# Patient Record
Sex: Female | Born: 1975 | Race: White | Hispanic: No | Marital: Married | Smoking: Never smoker
Health system: Southern US, Community
[De-identification: ages and names within clinical notes are randomized; demographics above are authoritative.]

## PROBLEM LIST (undated history)

## (undated) DIAGNOSIS — K5792 Diverticulitis of intestine, part unspecified, without perforation or abscess without bleeding: Secondary | ICD-10-CM

## (undated) DIAGNOSIS — Z789 Other specified health status: Secondary | ICD-10-CM

## (undated) HISTORY — PX: ABDOMINAL HYSTERECTOMY: SHX81

---

## 2004-08-04 ENCOUNTER — Emergency Department: Payer: Self-pay | Admitting: Unknown Physician Specialty

## 2006-09-22 ENCOUNTER — Ambulatory Visit: Payer: Self-pay | Admitting: General Surgery

## 2006-11-18 ENCOUNTER — Ambulatory Visit: Payer: Self-pay | Admitting: General Surgery

## 2008-01-10 ENCOUNTER — Emergency Department: Payer: Self-pay | Admitting: Emergency Medicine

## 2008-01-13 ENCOUNTER — Ambulatory Visit: Payer: Self-pay | Admitting: Internal Medicine

## 2009-12-16 ENCOUNTER — Ambulatory Visit: Payer: Self-pay | Admitting: Family Medicine

## 2010-03-12 ENCOUNTER — Ambulatory Visit: Payer: Self-pay | Admitting: Otolaryngology

## 2010-06-17 ENCOUNTER — Ambulatory Visit: Payer: Self-pay | Admitting: General Practice

## 2011-10-28 ENCOUNTER — Ambulatory Visit: Payer: Self-pay | Admitting: General Practice

## 2012-01-06 ENCOUNTER — Ambulatory Visit: Payer: Self-pay | Admitting: Family Medicine

## 2012-04-26 ENCOUNTER — Encounter: Payer: Self-pay | Admitting: Neurosurgery

## 2012-05-18 ENCOUNTER — Ambulatory Visit: Payer: Self-pay | Admitting: Obstetrics and Gynecology

## 2012-05-18 LAB — URINALYSIS, COMPLETE
Glucose,UR: NEGATIVE mg/dL (ref 0–75)
Ketone: NEGATIVE
Leukocyte Esterase: NEGATIVE
Nitrite: NEGATIVE
Ph: 5 (ref 4.5–8.0)
Protein: NEGATIVE
RBC,UR: NONE SEEN /HPF (ref 0–5)
WBC UR: 1 /HPF (ref 0–5)

## 2012-05-18 LAB — PREGNANCY, URINE: Pregnancy Test, Urine: NEGATIVE m[IU]/mL

## 2012-05-18 LAB — CBC: MCV: 90 fL (ref 80–100)

## 2012-05-22 ENCOUNTER — Encounter: Payer: Self-pay | Admitting: Neurosurgery

## 2012-05-25 ENCOUNTER — Ambulatory Visit: Payer: Self-pay | Admitting: Obstetrics and Gynecology

## 2012-05-26 LAB — PATHOLOGY REPORT

## 2012-06-11 ENCOUNTER — Emergency Department: Payer: Self-pay | Admitting: Emergency Medicine

## 2012-06-11 LAB — HCG, QUANTITATIVE, PREGNANCY: Beta Hcg, Quant.: <1 m[IU]/mL — ABNORMAL LOW

## 2012-06-11 LAB — COMPREHENSIVE METABOLIC PANEL
Bilirubin,Total: 0.3 mg/dL (ref 0.2–1.0)
Calcium, Total: 8.5 mg/dL (ref 8.5–10.1)
Chloride: 110 mmol/L — ABNORMAL HIGH (ref 98–107)
Co2: 26 mmol/L (ref 21–32)
Creatinine: 0.87 mg/dL (ref 0.60–1.30)
Osmolality: 279 (ref 275–301)
SGOT(AST): 32 U/L (ref 15–37)
Total Protein: 6 g/dL — ABNORMAL LOW (ref 6.4–8.2)

## 2012-06-11 LAB — CBC
HGB: 13.3 g/dL (ref 12.0–16.0)
MCH: 31.5 pg (ref 26.0–34.0)
RBC: 4.23 10*6/uL (ref 3.80–5.20)
RDW: 13.7 % (ref 11.5–14.5)
WBC: 9.6 10*3/uL (ref 3.6–11.0)

## 2012-08-30 ENCOUNTER — Encounter: Payer: Self-pay | Admitting: Orthopedic Surgery

## 2012-09-21 ENCOUNTER — Encounter: Payer: Self-pay | Admitting: Orthopedic Surgery

## 2012-09-21 ENCOUNTER — Other Ambulatory Visit: Payer: Self-pay | Admitting: Orthopedic Surgery

## 2012-09-28 ENCOUNTER — Ambulatory Visit (HOSPITAL_COMMUNITY)
Admission: RE | Admit: 2012-09-28 | Discharge: 2012-09-28 | Disposition: A | Discharge status: Home / Self Care | Payer: Worker's Compensation | Source: Ambulatory Visit | Attending: Orthopedic Surgery | Admitting: Orthopedic Surgery

## 2012-09-28 ENCOUNTER — Encounter (HOSPITAL_COMMUNITY)
Admission: RE | Admit: 2012-09-28 | Discharge: 2012-09-28 | Disposition: A | Discharge status: Home / Self Care | Payer: Worker's Compensation | Source: Ambulatory Visit | Attending: Orthopedic Surgery | Admitting: Orthopedic Surgery

## 2012-09-28 ENCOUNTER — Encounter (HOSPITAL_COMMUNITY): Payer: Self-pay

## 2012-09-28 ENCOUNTER — Encounter (HOSPITAL_COMMUNITY): Payer: Self-pay | Admitting: Pharmacy Technician

## 2012-09-28 DIAGNOSIS — Z01818 Encounter for other preprocedural examination: Secondary | ICD-10-CM | POA: Insufficient documentation

## 2012-09-28 HISTORY — DX: Other specified health status: Z78.9

## 2012-09-28 LAB — COMPREHENSIVE METABOLIC PANEL
ALT: 26 U/L (ref 0–35)
AST: 23 U/L (ref 0–37)
Albumin: 3.4 g/dL — ABNORMAL LOW (ref 3.5–5.2)
Calcium: 9.1 mg/dL (ref 8.4–10.5)
GFR calc Af Amer: >90 mL/min (ref >90)
Potassium: 4.8 mEq/L (ref 3.5–5.1)
Sodium: 138 mEq/L (ref 135–145)
Total Protein: 6.5 g/dL (ref 6.0–8.3)

## 2012-09-28 LAB — TYPE AND SCREEN
ABO/RH(D): A POS
Antibody Screen: NEGATIVE

## 2012-09-28 LAB — SURGICAL PCR SCREEN
MRSA, PCR: NEGATIVE
Staphylococcus aureus: NEGATIVE

## 2012-09-28 LAB — CBC WITH DIFFERENTIAL/PLATELET
Basophils Absolute: 0.1 10*3/uL (ref 0.0–0.1)
Eosinophils Absolute: 0.2 10*3/uL (ref 0.0–0.7)
Eosinophils Relative: 2 % (ref 0–5)
MCH: 29 pg (ref 26.0–34.0)
MCV: 86.8 fL (ref 78.0–100.0)
Neutrophils Relative %: 57 % (ref 43–77)
Platelets: 278 10*3/uL (ref 150–400)
RDW: 15.1 % (ref 11.5–15.5)
WBC: 10.3 10*3/uL (ref 4.0–10.5)

## 2012-09-28 LAB — URINALYSIS, ROUTINE W REFLEX MICROSCOPIC
Hgb urine dipstick: NEGATIVE
Nitrite: NEGATIVE
Protein, ur: NEGATIVE mg/dL
Urobilinogen, UA: 0.2 mg/dL (ref 0.0–1.0)

## 2012-09-28 LAB — ABO/RH: ABO/RH(D): A POS

## 2012-09-28 MED ORDER — CEFAZOLIN SODIUM-DEXTROSE 2-3 GM-% IV SOLR
2.0000 g | INTRAVENOUS | Status: AC
  Administered 2012-09-29: 2 g via INTRAVENOUS
  Filled 2012-09-28: qty 50

## 2012-09-28 NOTE — Pre-Procedure Instructions (Signed)
ABBIGAYLE TOOLE  09/28/2012   Your procedure is scheduled on:  09/29/12  Report to [facility] at 930 AM.  Call this number if you have problems the morning of surgery: [phone]   Remember:   Do not eat food or drink liquids after midnight.   Take these medicines the morning of surgery with A SIP OF WATER: none   Do not wear jewelry, make-up or nail polish.  Do not wear lotions, powders, or perfumes. You may wear deodorant.  Do not shave 48 hours prior to surgery. Men may shave face and neck.  Do not bring valuables to the hospital.  [facility] is not responsible                   for any belongings or valuables.  Contacts, dentures or bridgework may not be worn into surgery.  Leave suitcase in the car. After surgery it may be brought to your room.  For patients admitted to the hospital, checkout time is 11:00 AM the day of  discharge.   Patients discharged the day of surgery will not be allowed to drive  home.  Name and phone number of your driver: family  Special Instructions: Shower using CHG 2 nights before surgery and the night before surgery.  If you shower the day of surgery use CHG.  Use special wash - you have one bottle of CHG for all showers.  You should use approximately 1/3 of the bottle for each shower.   Please read over the following fact sheets that you were given: Pain Booklet, Coughing and Deep Breathing and Surgical Site Infection Prevention

## 2012-09-29 ENCOUNTER — Encounter (HOSPITAL_COMMUNITY): Payer: Self-pay | Admitting: *Deleted

## 2012-09-29 ENCOUNTER — Encounter (HOSPITAL_COMMUNITY): Payer: Self-pay | Admitting: Certified Registered Nurse Anesthetist

## 2012-09-29 ENCOUNTER — Encounter (HOSPITAL_COMMUNITY)
Admission: RE | Disposition: A | Discharge status: Home / Self Care | Payer: Self-pay | Source: Ambulatory Visit | Attending: Orthopedic Surgery

## 2012-09-29 ENCOUNTER — Ambulatory Visit (HOSPITAL_COMMUNITY): Payer: Worker's Compensation

## 2012-09-29 ENCOUNTER — Ambulatory Visit (HOSPITAL_COMMUNITY): Payer: Worker's Compensation | Admitting: Certified Registered Nurse Anesthetist

## 2012-09-29 ENCOUNTER — Ambulatory Visit (HOSPITAL_COMMUNITY)
Admission: RE | Admit: 2012-09-29 | Discharge: 2012-09-29 | Disposition: A | Discharge status: Home / Self Care | Payer: Worker's Compensation | Source: Ambulatory Visit | Attending: Orthopedic Surgery | Admitting: Orthopedic Surgery

## 2012-09-29 DIAGNOSIS — Z79899 Other long term (current) drug therapy: Secondary | ICD-10-CM | POA: Insufficient documentation

## 2012-09-29 DIAGNOSIS — M533 Sacrococcygeal disorders, not elsewhere classified: Secondary | ICD-10-CM | POA: Insufficient documentation

## 2012-09-29 DIAGNOSIS — Z91010 Allergy to peanuts: Secondary | ICD-10-CM | POA: Insufficient documentation

## 2012-09-29 HISTORY — PX: SACROILIAC JOINT FUSION: SHX6088

## 2012-09-29 SURGERY — SACROILIAC JOINT FUSION
Anesthesia: General | Site: Hip | Laterality: Left | Wound class: Clean

## 2012-09-29 MED ORDER — 0.9 % SODIUM CHLORIDE (POUR BTL) OPTIME
TOPICAL | Status: DC | PRN
  Administered 2012-09-29: 1000 mL

## 2012-09-29 MED ORDER — HYDROCODONE-ACETAMINOPHEN 5-325 MG PO TABS
ORAL_TABLET | ORAL | Status: AC
  Filled 2012-09-29: qty 1

## 2012-09-29 MED ORDER — HYDROMORPHONE HCL PF 1 MG/ML IJ SOLN
INTRAMUSCULAR | Status: AC
  Administered 2012-09-29: 1 mg
  Filled 2012-09-29: qty 1

## 2012-09-29 MED ORDER — HYDROMORPHONE HCL PF 1 MG/ML IJ SOLN
INTRAMUSCULAR | Status: AC
  Filled 2012-09-29: qty 1

## 2012-09-29 MED ORDER — PROPOFOL 10 MG/ML IV BOLUS
INTRAVENOUS | Status: DC | PRN
  Administered 2012-09-29: 200 mg via INTRAVENOUS

## 2012-09-29 MED ORDER — LIDOCAINE HCL (CARDIAC) 20 MG/ML IV SOLN
INTRAVENOUS | Status: DC | PRN
  Administered 2012-09-29: 60 mg via INTRAVENOUS

## 2012-09-29 MED ORDER — HYDROCODONE-ACETAMINOPHEN 5-325 MG PO TABS
1.0000 | ORAL_TABLET | ORAL | Status: DC | PRN
  Administered 2012-09-29: 1 via ORAL

## 2012-09-29 MED ORDER — MIDAZOLAM HCL 5 MG/5ML IJ SOLN
INTRAMUSCULAR | Status: DC | PRN
  Administered 2012-09-29 (×2): 2 mg via INTRAVENOUS

## 2012-09-29 MED ORDER — ROCURONIUM BROMIDE 100 MG/10ML IV SOLN
INTRAVENOUS | Status: DC | PRN
  Administered 2012-09-29: 50 mg via INTRAVENOUS

## 2012-09-29 MED ORDER — LACTATED RINGERS IV SOLN
INTRAVENOUS | Status: DC
  Administered 2012-09-29: 12:00:00 via INTRAVENOUS

## 2012-09-29 MED ORDER — BUPIVACAINE-EPINEPHRINE PF 0.25-1:200000 % IJ SOLN
INTRAMUSCULAR | Status: AC
  Filled 2012-09-29: qty 30

## 2012-09-29 MED ORDER — ONDANSETRON HCL 4 MG/2ML IJ SOLN: 4.0000 mg | Freq: Once | INTRAMUSCULAR | Status: DC | PRN

## 2012-09-29 MED ORDER — FENTANYL CITRATE 0.05 MG/ML IJ SOLN
INTRAMUSCULAR | Status: DC | PRN
  Administered 2012-09-29 (×2): 50 ug via INTRAVENOUS
  Administered 2012-09-29: 100 ug via INTRAVENOUS
  Administered 2012-09-29: 50 ug via INTRAVENOUS

## 2012-09-29 MED ORDER — BUPIVACAINE-EPINEPHRINE PF 0.25-1:200000 % IJ SOLN
INTRAMUSCULAR | Status: DC | PRN
  Administered 2012-09-29: 16 mL

## 2012-09-29 MED ORDER — HYDROMORPHONE HCL PF 1 MG/ML IJ SOLN
0.5000 mg | INTRAMUSCULAR | Status: DC | PRN
  Administered 2012-09-29: 1 mg via INTRAVENOUS

## 2012-09-29 MED ORDER — POVIDONE-IODINE 7.5 % EX SOLN
Freq: Once | CUTANEOUS | Status: DC
Start: 2012-09-29 — End: 2012-09-29
  Filled 2012-09-29: qty 118

## 2012-09-29 MED ORDER — LACTATED RINGERS IV SOLN
INTRAVENOUS | Status: DC | PRN
  Administered 2012-09-29 (×2): via INTRAVENOUS

## 2012-09-29 MED ORDER — GLYCOPYRROLATE 0.2 MG/ML IJ SOLN
INTRAMUSCULAR | Status: DC | PRN
  Administered 2012-09-29: 0.6 mg via INTRAVENOUS

## 2012-09-29 MED ORDER — ONDANSETRON HCL 4 MG/2ML IJ SOLN
INTRAMUSCULAR | Status: DC | PRN
  Administered 2012-09-29 (×2): 4 mg via INTRAVENOUS

## 2012-09-29 MED ORDER — DEXAMETHASONE SODIUM PHOSPHATE 4 MG/ML IJ SOLN
INTRAMUSCULAR | Status: DC | PRN
  Administered 2012-09-29: 4 mg via INTRAVENOUS

## 2012-09-29 MED ORDER — DIPHENHYDRAMINE HCL 50 MG/ML IJ SOLN
INTRAMUSCULAR | Status: DC | PRN
  Administered 2012-09-29: 25 mg via INTRAVENOUS

## 2012-09-29 MED ORDER — HYDROMORPHONE HCL PF 1 MG/ML IJ SOLN
0.2500 mg | INTRAMUSCULAR | Status: DC | PRN
  Administered 2012-09-29 (×2): 0.5 mg via INTRAVENOUS

## 2012-09-29 MED ORDER — NEOSTIGMINE METHYLSULFATE 1 MG/ML IJ SOLN
INTRAMUSCULAR | Status: DC | PRN
  Administered 2012-09-29: 4 mg via INTRAVENOUS

## 2012-09-29 MED ORDER — ESMOLOL HCL 10 MG/ML IV SOLN
INTRAVENOUS | Status: DC | PRN
  Administered 2012-09-29: 20 mg via INTRAVENOUS
  Administered 2012-09-29: 10 mg via INTRAVENOUS

## 2012-09-29 MED ORDER — ARTIFICIAL TEARS OP OINT
TOPICAL_OINTMENT | OPHTHALMIC | Status: DC | PRN
  Administered 2012-09-29: 1 via OPHTHALMIC

## 2012-09-29 MED ORDER — ACETAMINOPHEN 10 MG/ML IV SOLN: 1000.0000 mg | Freq: Once | INTRAVENOUS | Status: DC | PRN

## 2012-09-29 SURGICAL SUPPLY — 52 items
BENZOIN TINCTURE PRP APPL 2/3 (GAUZE/BANDAGES/DRESSINGS) ×2 IMPLANT
BLADE SURG 10 STRL SS (BLADE) ×2 IMPLANT
BLADE SURG 11 STRL SS (BLADE) ×2 IMPLANT
BLADE SURG ROTATE 9660 (MISCELLANEOUS) ×2 IMPLANT
CANISTER SUCTION 2500CC (MISCELLANEOUS) ×2 IMPLANT
CAP-I-FUSE IMPLANT SYSTEM ×2 IMPLANT
CLOTH BEACON ORANGE TIMEOUT ST (SAFETY) ×2 IMPLANT
CLSR STERI-STRIP ANTIMIC 1/2X4 (GAUZE/BANDAGES/DRESSINGS) ×2 IMPLANT
COVER SURGICAL LIGHT HANDLE (MISCELLANEOUS) ×4 IMPLANT
DRAPE C-ARM 42X72 X-RAY (DRAPES) IMPLANT
DRAPE C-ARMOR (DRAPES) ×2 IMPLANT
DRAPE INCISE IOBAN 66X45 STRL (DRAPES) ×2 IMPLANT
DRAPE POUCH INSTRU U-SHP 10X18 (DRAPES) ×2 IMPLANT
DRAPE SURG 17X23 STRL (DRAPES) ×6 IMPLANT
DURAPREP 26ML APPLICATOR (WOUND CARE) ×2 IMPLANT
ELECT CAUTERY BLADE 6.4 (BLADE) ×2 IMPLANT
ELECT REM PT RETURN 9FT ADLT (ELECTROSURGICAL) ×2
ELECTRODE REM PT RTRN 9FT ADLT (ELECTROSURGICAL) ×1 IMPLANT
GAUZE SPONGE 4X4 16PLY XRAY LF (GAUZE/BANDAGES/DRESSINGS) ×2 IMPLANT
GLOVE BIO SURGEON STRL SZ7 (GLOVE) ×2 IMPLANT
GLOVE BIO SURGEON STRL SZ8 (GLOVE) ×2 IMPLANT
GLOVE BIOGEL PI IND STRL 7.0 (GLOVE) ×1 IMPLANT
GLOVE BIOGEL PI IND STRL 8 (GLOVE) ×1 IMPLANT
GLOVE BIOGEL PI INDICATOR 7.0 (GLOVE) ×1
GLOVE BIOGEL PI INDICATOR 8 (GLOVE) ×1
GOWN STRL NON-REIN LRG LVL3 (GOWN DISPOSABLE) ×4 IMPLANT
GOWN STRL REIN XL XLG (GOWN DISPOSABLE) ×2 IMPLANT
KIT BASIN OR (CUSTOM PROCEDURE TRAY) ×2 IMPLANT
KIT ROOM TURNOVER OR (KITS) ×2 IMPLANT
MANIFOLD NEPTUNE II (INSTRUMENTS) ×2 IMPLANT
NEEDLE 22X1 1/2 (OR ONLY) (NEEDLE) ×2 IMPLANT
NEEDLE HYPO 25GX1X1/2 BEV (NEEDLE) ×2 IMPLANT
NS IRRIG 1000ML POUR BTL (IV SOLUTION) ×2 IMPLANT
PACK UNIVERSAL I (CUSTOM PROCEDURE TRAY) ×2 IMPLANT
PAD ARMBOARD 7.5X6 YLW CONV (MISCELLANEOUS) ×4 IMPLANT
PENCIL BUTTON HOLSTER BLD 10FT (ELECTRODE) ×2 IMPLANT
SPONGE GAUZE 4X4 12PLY (GAUZE/BANDAGES/DRESSINGS) ×2 IMPLANT
SPONGE LAP 18X18 X RAY DECT (DISPOSABLE) ×2 IMPLANT
STAPLER VISISTAT 35W (STAPLE) IMPLANT
STRIP CLOSURE SKIN 1/2X4 (GAUZE/BANDAGES/DRESSINGS) ×2 IMPLANT
SUT MNCRL AB 4-0 PS2 18 (SUTURE) ×2 IMPLANT
SUT VIC AB 0 CT1 18XCR BRD 8 (SUTURE) ×1 IMPLANT
SUT VIC AB 0 CT1 8-18 (SUTURE) ×1
SUT VIC AB 2-0 CT2 18 VCP726D (SUTURE) ×2 IMPLANT
SYR BULB IRRIGATION 50ML (SYRINGE) ×2 IMPLANT
SYR CONTROL 10ML LL (SYRINGE) ×2 IMPLANT
TAPE CLOTH SURG 6X10 WHT LF (GAUZE/BANDAGES/DRESSINGS) ×2 IMPLANT
TOWEL OR 17X24 6PK STRL BLUE (TOWEL DISPOSABLE) ×2 IMPLANT
TOWEL OR 17X26 10 PK STRL BLUE (TOWEL DISPOSABLE) ×4 IMPLANT
TUBE CONNECTING 12X1/4 (SUCTIONS) ×2 IMPLANT
WATER STERILE IRR 1000ML POUR (IV SOLUTION) ×2 IMPLANT
YANKAUER SUCT BULB TIP NO VENT (SUCTIONS) ×2 IMPLANT

## 2012-09-29 NOTE — H&P (Signed)
PREOPERATIVE H&P  Chief Complaint: left low back pain  HPI: Gina Hester is a 37 y.o. female who presents with ongoing left low back pain from SI dysfunction. Failed multiple forms of conservative care.  Past Medical History  Diagnosis Date  . Medical history non-contributory    Past Surgical History  Procedure Laterality Date  . Abdominal hysterectomy     History   Social History  . Marital Status: Married    Spouse Name: N/A    Number of Children: N/A  . Years of Education: N/A   Social History Main Topics  . Smoking status: Never Smoker   . Smokeless tobacco: Not on file  . Alcohol Use: Yes     Comment: occ  . Drug Use: No  . Sexually Active: Not on file   Other Topics Concern  . Not on file   Social History Narrative  . No narrative on file   No family history on file. Allergies  Allergen Reactions  . Peanut-Containing Drug Products Anaphylaxis  . Percocet (Oxycodone-Acetaminophen) Nausea Only   Prior to Admission medications   Medication Sig Start Date End Date Taking? Authorizing Provider  cyclobenzaprine (FLEXERIL) 10 MG tablet Take 10 mg by mouth 2 (two) times daily with a meal.    Historical Provider, MD  ibuprofen (ADVIL,MOTRIN) 200 MG tablet Take 200 mg by mouth every 6 (six) hours as needed for pain. For pain    Historical Provider, MD  meloxicam (MOBIC) 15 MG tablet Take 15 mg by mouth every morning.    Historical Provider, MD  metaxalone (SKELAXIN) 800 MG tablet Take 800 mg by mouth 2 (two) times daily.    Historical Provider, MD     All other systems have been reviewed and were otherwise negative with the exception of those mentioned in the HPI and as above.  Physical Exam: There were no vitals filed for this visit.  General: Alert, no acute distress Cardiovascular: No pedal edema Respiratory: No cyanosis, no use of accessory musculature GI: No organomegaly, abdomen is soft and non-tender Skin: No lesions in the area of chief  complaint Neurologic: Sensation intact distally Psychiatric: Patient is competent for consent with normal mood and affect Lymphatic: No axillary or cervical lymphadenopathy  MUSCULOSKELETAL: + TTP left low back  Assessment/Plan: Left SI dysfunction Plan for Procedure(s): LEFT SACROILIAC JOINT FUSION   Emilee Hero, MD 09/29/2012 8:16 AM

## 2012-09-29 NOTE — Transfer of Care (Signed)
Immediate Anesthesia Transfer of Care Note  Patient: Gina Hester  Procedure(s) Performed: Procedure(s) with comments: SACROILIAC JOINT FUSION- left (Left) - Left sided sacroiliac joint fusion  Patient Location: PACU  Anesthesia Type:General  Level of Consciousness: awake, alert  and oriented  Airway & Oxygen Therapy: Patient Spontanous Breathing and Patient connected to nasal cannula oxygen  Post-op Assessment: Report given to PACU RN and Post -op Vital signs reviewed and stable  Post vital signs: Reviewed and stable  Complications: No apparent anesthesia complications

## 2012-09-29 NOTE — Anesthesia Procedure Notes (Signed)
Procedure Name: Intubation Date/Time: 09/29/2012 5:31 PM Performed by: Margaree Mackintosh Pre-anesthesia Checklist: Patient identified, Timeout performed, Emergency Drugs available, Suction available and Patient being monitored Patient Re-evaluated:Patient Re-evaluated prior to inductionOxygen Delivery Method: Circle system utilized Preoxygenation: Pre-oxygenation with 100% oxygen Intubation Type: IV induction Ventilation: Mask ventilation without difficulty Laryngoscope Size: Mac and 3 Grade View: Grade II Tube type: Oral Tube size: 7.0 mm Number of attempts: 1 Airway Equipment and Method: Stylet and LTA kit utilized Placement Confirmation: ETT inserted through vocal cords under direct vision,  positive ETCO2 and breath sounds checked- equal and bilateral Secured at: 22 cm Tube secured with: Tape Dental Injury: Teeth and Oropharynx as per pre-operative assessment

## 2012-09-29 NOTE — Anesthesia Preprocedure Evaluation (Signed)
Anesthesia Evaluation  Patient identified by MRN, date of birth, ID band Patient awake    Reviewed: Allergy & Precautions, H&P , NPO status , Patient's Chart, lab work & pertinent test results  Airway Mallampati: II      Dental  (+) Teeth Intact and Dental Advisory Given   Pulmonary  breath sounds clear to auscultation        Cardiovascular Rhythm:Regular Rate:Normal     Neuro/Psych    GI/Hepatic   Endo/Other    Renal/GU      Musculoskeletal   Abdominal   Peds  Hematology   Anesthesia Other Findings   Reproductive/Obstetrics                           Anesthesia Physical Anesthesia Plan  ASA: I  Anesthesia Plan: General   Post-op Pain Management:    Induction: Intravenous  Airway Management Planned: Oral ETT  Additional Equipment:   Intra-op Plan:   Post-operative Plan: Extubation in OR  Informed Consent: I have reviewed the patients History and Physical, chart, labs and discussed the procedure including the risks, benefits and alternatives for the proposed anesthesia with the patient or authorized representative who has indicated his/her understanding and acceptance.   Dental advisory given  Plan Discussed with: CRNA and Anesthesiologist  Anesthesia Plan Comments:         Anesthesia Quick Evaluation

## 2012-09-29 NOTE — Preoperative (Signed)
Beta Blockers   Reason not to administer Beta Blockers:Not Applicable 

## 2012-09-29 NOTE — Anesthesia Postprocedure Evaluation (Signed)
  Anesthesia Post-op Note  Patient: Gina Hester  Procedure(s) Performed: Procedure(s) with comments: SACROILIAC JOINT FUSION- left (Left) - Left sided sacroiliac joint fusion  Patient Location: PACU  Anesthesia Type:General  Level of Consciousness: awake, alert  and oriented  Airway and Oxygen Therapy: Patient Spontanous Breathing and Patient connected to nasal cannula oxygen  Post-op Pain: mild  Post-op Assessment: Post-op Vital signs reviewed, Patient's Cardiovascular Status Stable, Respiratory Function Stable, Patent Airway and Pain level controlled  Post-op Vital Signs: stable  Complications: No apparent anesthesia complications

## 2012-09-29 NOTE — Progress Notes (Signed)
Orthopedic Tech Progress Note Patient Details:  Gina Hester 05-Jan-1976 [id number]  Ortho Devices Type of Ortho Device: Crutches Ortho Device/Splint Interventions: Ordered;Adjustment   Jennye Moccasin 09/29/2012, 5:58 PM

## 2012-09-30 ENCOUNTER — Encounter (HOSPITAL_COMMUNITY): Payer: Self-pay | Admitting: Orthopedic Surgery

## 2012-09-30 NOTE — Op Note (Signed)
Gina Hester, Gina Hester NO.:  [id number]  MEDICAL RECORD NO.:  [id number]  LOCATION:  [facility]                         FACILITY:  [facility]  PHYSICIAN:  Estill Bamberg, MD      DATE OF BIRTH:  1976-01-17  DATE OF PROCEDURE:  09/29/2012 DATE OF DISCHARGE:  09/29/2012                              OPERATIVE REPORT   PREOPERATIVE DIAGNOSIS:  Left-sided sacroiliac joint dysfunction.  POSTOPERATIVE DIAGNOSIS:  Left-sided sacroiliac joint dysfunction.  PROCEDURES: 1. Left-sided sacroiliac joint fusion using the I-fuse sacroiliac     joint fixation system. 2. Intraoperative use of fluoroscopy.  SURGEON:  Estill Bamberg, MD  ASSISTANT:  Jason Coop, PA-C  ANESTHESIA:  General endotracheal anesthesia.  COMPLICATIONS:  None.  DISPOSITION:  Stable.  ESTIMATED BLOOD LOSS:  Minimal.  INDICATIONS FOR PROCEDURE:  Briefly, Gina Hester is a very pleasant 37 year old female who did initially present to me on July 19, 2012, with pain in both the right and the left sides of her low back.  This did occur after a work injury on November 13, 2011.  We did go forward with multiple forms of conservative care.  Of particular note, the patient did have bilateral sacroiliac joint injections, and did state that she got 100% relief after her injections.  These were intended to be a diagnostic injections.  Thus, I did feel the patient's dysfunction was secondary to sacroiliac joint dysfunction on both the right and the left sides. Given her ongoing pain and failure of conservative care, we did have a discussion regarding going forward with a left-sided sacroiliac joint fusion procedure.  The patient did fully understand the risks and limitations of the procedure.  Of particular note, she did understand that there was a 90% chance that the procedure would alleviate her pain, and a 10% chance that it would not.  Also of note, she did have dysfunction of her right sacroiliac joint as well.  The  plan of the surgery was to proceed with a fusion of her left sacroiliac joint, with possibly proceeding with a right-sided sacroiliac joint fusion in the staged fashion.  OPERATIVE DETAILS:  On September 29, 2012, the patient was brought to surgery and general endotracheal anesthesia was administered.  The patient was placed prone on a well-padded flat hospital bed.  Drills were placed under the patient's chest and hips.  Antibiotics were given.  The region of the left buttock was prepped and draped in the usual sterile fashion. I then made a 3 cm incision distal to the sacral ala.  I then advanced the guidewire across the sacroiliac joint, just above the S1 foramen. The guidewire was advanced across the sacroiliac joint liberally using lateral inlet and outlet radiographs's.  I did confirm excellent trajectory of the guidewire.  I then drilled and broached over the guidewire, and I placed a 7 x 60 mm implant across the sacroiliac joint. The second implant was placed below the first, in line with the S1 foramen.  The guidewire was stopped at the lateral aspect of the foramen, and the foramen was never violated.  I then drilled and broached over the guidewire and at this particular level for this particular implant, I placed  a 7 x 45 mm implant.  A 30 guidewire was placed below the second in the manner described previously.  I drilled and broached over the guide wire and a 7 x 35 mm implant was placed at this level.  I did confirm excellent positioning of the implants on both lateral inlet and outlet radiographs.  At this point, the wound was copiously irrigated.  The fascia was closed using 0 Vicryl and the subcutaneous layer was closed using 2-0 Vicryl.  The skin was closed using 3-0 Monocryl.  Benzoin and Steri-Strips were applied, followed by a sterile dressing.  All instrument counts were correct at the termination of the procedure.  Of note, Jason Coop is my assistant throughout the  entirety of the procedure and did aid in essential retraction, suctioning, and placement of the hardware, in addition to closure.     Estill Bamberg, MD     MD/MEDQ  D:  09/29/2012  T:  09/30/2012  Job:  [id number]  cc:   Jabier Mutton, MD

## 2013-02-24 ENCOUNTER — Encounter: Payer: Self-pay | Admitting: Orthopedic Surgery

## 2013-03-24 ENCOUNTER — Encounter: Payer: Self-pay | Admitting: Orthopedic Surgery

## 2013-04-24 ENCOUNTER — Encounter: Payer: Self-pay | Admitting: Orthopedic Surgery

## 2013-05-22 ENCOUNTER — Encounter: Payer: Self-pay | Admitting: Orthopedic Surgery

## 2013-06-22 ENCOUNTER — Encounter: Payer: Self-pay | Admitting: Orthopedic Surgery

## 2013-07-22 ENCOUNTER — Encounter: Payer: Self-pay | Admitting: Orthopedic Surgery

## 2013-08-22 ENCOUNTER — Encounter: Payer: Self-pay | Admitting: Orthopedic Surgery

## 2014-03-24 DIAGNOSIS — K5792 Diverticulitis of intestine, part unspecified, without perforation or abscess without bleeding: Secondary | ICD-10-CM

## 2014-03-24 HISTORY — DX: Diverticulitis of intestine, part unspecified, without perforation or abscess without bleeding: K57.92

## 2014-07-14 NOTE — Op Note (Signed)
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   POSTOPERATIVE DIAGNOSIS: CIN-2 or moderate cervical dysplasia.   PROCEDURES PERFORMED: Total laparoscopic hysterectomy, cystoscopy.   SURGEON: Senaida LangeLashawn Weaver Lee, M.Hester.   ASSISTANT: Thomasene MohairStephen Jackson, MD   ANESTHESIA: General.   ESTIMATED BLOOD LOSS: 25 mL.  OPERATIVE FLUIDS: 1 liter.   COMPLICATIONS: None.   FINDINGS: Normal appearing uterus, tubes and ovaries.   SPECIMEN: Uterus with cervix.   INDICATIONS: The patient is a 39 year old who presents for evaluation of persistent abnormal Pap smears and findings of CIN-2 on recent colposcopy. The patient desired definitive surgical management in the form of hysterectomy. Risks, benefits, indications and alternatives of the procedure were explained and informed consent was obtained.   DESCRIPTION OF PROCEDURE: The patient was taken to the operating room with IV fluids running. She was prepped and draped in the usual sterile fashion, in [place] stirrups. A speculum was placed inside the vagina. The anterior lip of the cervix was grasped with a single-tooth tenaculum. The uterus was sounded and the cervix was dilated. The V-care uterine manipulator was placed. Attention was turned to the patient's abdomen where a 5 mm infraumbilical incision was placed after 0.5% Sensorcaine was injected. The Veress needle was placed. The abdomen was insufflated with CO2 gas. The Veress needle was removed. The abdomen was entered under direct visualization using a 5 mm Xcel trocar. Two additional incisions and ports were placed on the patient's abdomen laterally, both were 11 mm Xcel trocars. The left uterine cornu was grasped. The tube, the utero-ovarian ligament and the round ligament were cauterized with the Kleppinger and transected with the Harmonic scalpel. The bladder flap was created  using the Harmonic scalpel. The uterine artery was skeletonized, on the left side. It was cauterized with the Kleppinger and transected using the Harmonic scalpel as was the remainder of the broad ligament. The V-care cup was identified. At this time, the right side of the uterus was identified and the surgical steps were repeated. The V-Care cup was identified and the colpotomy was made around the V-Care cup. The uterus was removed through the vagina. The vagina was closed using the Endo Stitch apparatus. All surgical areas were irrigated and found to be hemostatic. The patient was given indigo carmine dye. Attention was turned to the patient's vaginal area where the Foley catheter, which was placed at the beginning of the case, was removed. Cystoscopy was performed. Eye was seen to spill from both ureteral orifices indicating patency of both ureters. There was no bladder injury that was noted. Attention was turned back to the patient's abdomen where again the surgical area was identified. There was no blue dye spillage into the pelvic area indicating integrity of the bladder. The fascia of the 11 mm incisions were closed with 0 Vicryl. The skin was closed with 4-0 Vicryl and the skin of the infraumbilical incision was closed with Dermabond. Sponge, needle and instrument counts were correct x 2, and the patient was awakened from anesthesia and taken to the recovery room in stable condition. ____________________________ Sonda PrimesLashawn A. Patton SallesWeaver-Lee, MD law:sb Hester: 05/25/2012 09:52:37 ET T: 05/25/2012 10:32:18 ET JOB#: [id number]  cc: Flint MelterLashawn A. Patton SallesWeaver-Lee, MD, <Dictator> Sonda PrimesLASHAWN A WEAVER LEE MD ELECTRONICALLY SIGNED 06/03/2012 10:15

## 2014-07-14 NOTE — Consult Note (Signed)
PATIENT NAME:  Gina Hester, Gina Hester MR#:  [id number] DATE OF BIRTH:  [birth year]  DATE OF CONSULTATION:  06/11/2012  REFERRING PHYSICIAN:  Daryel NovemberJonathan Williams, MD CONSULTING PHYSICIAN:  Conard NovakStephen Hester. Erla Bacchi, MD  REASON FOR CONSULTATION:  Vaginal hemorrhage in the setting of being postoperative from a hysterectomy three weeks ago.     HISTORY OF PRESENT ILLNESS:  The patient is a 39 year old G1, 29P1 female who is status post total laparoscopic hysterectomy for cervical dysplasia on March 4th.  The surgery was uncomplicated.  After the surgery she had cystoscopy which was normal.  She was seen in the office last week and per the patient's report she was started on both Keflex as well as clindamycin for what per her report sounds like a urinary tract infection.  She states that she had a little bit of spotting at the time of that visit, but was told that that is a normal finding.  She was also told that the vaginal cuff was intact where the stitches were located.  She awoke this morning and went to the restroom. When she went to the bathroom she noticed a large gush of bright red blood as well as clots.  She continued to have passage of clots as well as bright red bleeding and called EMS.  She states that at this point her bleeding has slowed considerably.  She does feel somewhat weak, but apart from that was mostly concerned about the bleeding given her recent surgery.  She denies fevers and chills.  Denies urinary symptoms.    PAST MEDICAL HISTORY:  Cervical dysplasia.   PAST SURGICAL HISTORY: 1.  Tonsillectomy.  2.  Total laparoscopic hysterectomy and cystoscopy on 05/25/2012 with Dr. Senaida LangeLashawn Weaver-Lee.   CURRENT MEDICATIONS:   1.  Keflex. 2.  Clindamycin.  3.  Hydrocodone.   ALLERGIES:  1.  PENICILLIN. 2.  PEANUTS.  OBSTETRICAL HISTORY:  She is a gravida 1, para 1.  She is vaginally parous x 1.   GYNECOLOGIC HISTORY:  History of CIN-2 cervical dysplasia.   SOCIAL HISTORY:  The patient denies  use of alcohol, tobacco as well as drugs.   FAMILY HISTORY:  Noncontributory.   PHYSICAL EXAMINATION:  VITAL SIGNS:  Temp 97.4 degrees, pulse 77, blood pressure 141/87, respiratory rate 18, O2 saturation 99% on room air.   GENERAL:  No apparent distress.  CARDIOVASCULAR:  Regular rate and rhythm.  PULMONARY:  Clear to auscultation bilaterally.  ABDOMEN:  Soft.  Mildly tender to palpation in the suprapubic region.  Positive bowel sounds noted.  No rebound or guarding.  Her surgical incision scars are all without erythema, induration, warmth or tenderness.  They still have some Dermabond in place.  EXTREMITIES:  No erythema, cords or tenderness.  PELVIC:  Reveals normal external female genitalia.  Normal Bartholin, Skene's glands.  Normal urethral meatus.  Vaginal mucosa appears pink and normally rugated.  There is a large amount of what appears to be an old clot, approximately 80 to 100 mL were removed after placement of the speculum in the form of mostly clot with some small amount of bright red blood.  After removal of clot and bright red blood, the vaginal cuff was able to be visualized where approximately a 1.5 cm portion appeared to be opened with only very mild oozing from the edges of the cuff with no active bleeding or no active extrusion of clot through the defect.  Silver nitrate was applied to the vaginal epithelial edges of the dehiscence.  Bimanual  examination reveals only mild cuff tenderness with the defect palpated, however there was no definite communication with the defect and intra-abdominal cavity.  There felt like some tissue in place, difficult to distinguish its characteristic.  The size of the palpable defect correlates well with the visual defect.   IMAGING STUDIES:  CT of the abdomen, pelvis is done on 06/11/2012.  Significant findings include a 6.9 x 5.2 cm complex fluid collection in the region of the vaginal cuff that is just posterior to the bladder, most concerning for  hematoma (this is per radiology report).  There is a small amount of pelvic free fluid.  No other significant findings noted.    LABORATORY VALUES:  (Pertinents only) glucose 88, creatinine 0.87, sodium 141, potassium 3.9, serum CO2 26.  Serum chloride 110, anion gap 5.  LFTs within normal limits.  WBC equals 9.6, hemoglobin equal to 13.3, hematocrit equaled 37.9, platelets equals 259, MCV equals 90.   ASSESSMENT AND RECOMMENDATIONS:  This is a 39 year old female who is now 17 days postop from a total laparoscopic hysterectomy for cervical dysplasia with a vaginal cuff dehiscence as well as hematoma.   1.  Vaginal cuff dehiscence:  The dehiscence is a small defect that appears to be present and is a likely passageway for passage of clot which is likely emanating from the pelvis.  There may be some active bleeding from the edge of the dehiscence either from the vaginal epithelium or potentially some granulation tissue.  I attempted to cauterize this tissue edges using silver nitrate topically.  This did seem to stop any oozing that was present prior to the application of this medication topically.  Bimanual examination confirms the visual inspection of this area in that there does not appear to be a definite communication with the pelvic cavity and it appears to be an area that may allow for drainage of pelvic hematoma.  She is stable from vital sign standpoint, therefore there is no emergent need to return her to the operating room to fix the vaginal cuff defect at this point.   2.  Pelvic hematoma:  It is hard to know the exact origin of this hematoma.  Possible origins include active venous bleeding that has been very slow since the surgery.  Other source could be from the cervical cuff dehiscence with the bleeding from either the edge of the peritoneum if disrupted or from the edge of the vaginal cuff itself.  Given her clinical stability it merits observation.  Her vital signs are stable and her blood  counts are such that she is likely a good candidate for outpatient observation.  I have discussed this with her and she is comfortable with follow-up on Monday, in 3 days, with Dr. Patton Salles.  I did give her very strict bleeding precautions should her bleeding increase, but if she is soaking through greater than one pad per hour or if her bleeding increases to a point that is concerning to her or should she have symptoms of having too much blood loss.  With these symptoms she should return to the ER.  Of note, after my consultation and after my recommendations had been made to the attending in the Emergency Room, another set of labs were drawn and I only noted them after checking in on the patient to see if she was still in the hospital and to gather my notes for this dictation, that another hemoglobin and hematocrit had been checked.  The new hemoglobin was 10.1, down from  13.3 and the hematocrit was 30.2, down from 37.9.  It does appear that patient has been discharged at this time.  Again, the patient does know how to access the on-call physician and we will have her follow up Monday unless she needs to return to the hospital for any reason.    ____________________________ Conard Novak, MD sdj:ea Hester: 06/11/2012 16:37:03 ET T: 06/11/2012 23:50:06 ET JOB#: [id number]  cc: Conard Novak, MD, <Dictator> Conard Novak MD ELECTRONICALLY SIGNED 07/19/2012 13:11

## 2014-07-14 NOTE — Consult Note (Signed)
Brief Consult Note: Diagnosis: vaginal cuff hematoma, small vaginal cuff dehiscence.   Patient was seen by consultant.   Consult note dictated.   Discussed with Attending MD.   Comments: patient is hemodynamically stable, afebrile, with normal labs (hct 37, wbc 9.6). She otherwise feels well.  Will have her follow up in 3 days in clinic with Dr. Patton SallesWeaver-Lee. Spoke by phone with Dr. Patton SallesWeaver-Lee to give her update on her patient. ** Of note: another hgb/hct was drawn after this consultation. the result was 10.1/30.2.  I was not made aware of this drop in lab, nor was I aware it was being drawn.  I had made my report to attending who requested consult.  Patient was subsequently discharged after this result. I did give patient strict bleeding precautions (>1 pad per hour or bleeding causing further concern).  Electronic Signatures for Addendum Section:  Conard NovakJackson, Whitlee Sluder D (MD) (Signed Addendum 21-Mar-14 16:43)  Called patient to discuss new lab findings and left generic VM.  Dr. Patton SallesWeaver-Lee is on for gynecology this weekend. She has stated that she will call patient to follow up and make sure she knows when/if to return to ER.   Electronic Signatures: Conard NovakJackson, Melonie Germani D (MD)  (Signed 21-Mar-14 16:22)  Authored: Brief Consult Note   Last Updated: 21-Mar-14 16:43 by Conard NovakJackson, Akaya Proffit D (MD)

## 2014-10-09 ENCOUNTER — Ambulatory Visit
Admission: RE | Admit: 2014-10-09 | Discharge: 2014-10-09 | Disposition: A | Discharge status: Home / Self Care | Payer: PRIVATE HEALTH INSURANCE | Source: Ambulatory Visit | Attending: Orthopedic Surgery | Admitting: Orthopedic Surgery

## 2014-10-09 ENCOUNTER — Other Ambulatory Visit: Payer: Self-pay | Admitting: Orthopedic Surgery

## 2014-10-09 ENCOUNTER — Ambulatory Visit
Admission: RE | Admit: 2014-10-09 | Discharge: 2014-10-09 | Disposition: A | Discharge status: Home / Self Care | Payer: PRIVATE HEALTH INSURANCE | Source: Ambulatory Visit | Attending: Emergency Medicine | Admitting: Emergency Medicine

## 2014-10-09 DIAGNOSIS — R52 Pain, unspecified: Secondary | ICD-10-CM

## 2014-10-09 DIAGNOSIS — X58XXXA Exposure to other specified factors, initial encounter: Secondary | ICD-10-CM | POA: Diagnosis not present

## 2014-10-09 DIAGNOSIS — S99911A Unspecified injury of right ankle, initial encounter: Secondary | ICD-10-CM | POA: Diagnosis not present

## 2014-10-09 DIAGNOSIS — S99919A Unspecified injury of unspecified ankle, initial encounter: Secondary | ICD-10-CM | POA: Diagnosis present

## 2015-01-20 ENCOUNTER — Emergency Department: Payer: PRIVATE HEALTH INSURANCE

## 2015-01-20 ENCOUNTER — Encounter: Payer: Self-pay | Admitting: Emergency Medicine

## 2015-01-20 ENCOUNTER — Emergency Department
Admission: EM | Admit: 2015-01-20 | Discharge: 2015-01-20 | Disposition: A | Discharge status: Home / Self Care | Payer: PRIVATE HEALTH INSURANCE | Attending: Emergency Medicine | Admitting: Emergency Medicine

## 2015-01-20 DIAGNOSIS — Z791 Long term (current) use of non-steroidal anti-inflammatories (NSAID): Secondary | ICD-10-CM | POA: Diagnosis not present

## 2015-01-20 DIAGNOSIS — N83209 Unspecified ovarian cyst, unspecified side: Secondary | ICD-10-CM

## 2015-01-20 DIAGNOSIS — K921 Melena: Secondary | ICD-10-CM | POA: Diagnosis not present

## 2015-01-20 DIAGNOSIS — R1084 Generalized abdominal pain: Secondary | ICD-10-CM

## 2015-01-20 DIAGNOSIS — Z79899 Other long term (current) drug therapy: Secondary | ICD-10-CM | POA: Insufficient documentation

## 2015-01-20 DIAGNOSIS — R109 Unspecified abdominal pain: Secondary | ICD-10-CM | POA: Diagnosis present

## 2015-01-20 DIAGNOSIS — N83201 Unspecified ovarian cyst, right side: Secondary | ICD-10-CM | POA: Diagnosis not present

## 2015-01-20 HISTORY — DX: Diverticulitis of intestine, part unspecified, without perforation or abscess without bleeding: K57.92

## 2015-01-20 LAB — URINALYSIS COMPLETE WITH MICROSCOPIC (ARMC ONLY)
BILIRUBIN URINE: NEGATIVE
Bacteria, UA: NONE SEEN
GLUCOSE, UA: NEGATIVE mg/dL
Hgb urine dipstick: NEGATIVE
KETONES UR: NEGATIVE mg/dL
Leukocytes, UA: NEGATIVE
Nitrite: NEGATIVE
Protein, ur: NEGATIVE mg/dL
RBC / HPF: NONE SEEN RBC/hpf (ref 0–5)
Specific Gravity, Urine: 1.004 — ABNORMAL LOW (ref 1.005–1.030)
pH: 7 (ref 5.0–8.0)

## 2015-01-20 LAB — COMPREHENSIVE METABOLIC PANEL
ALT: 13 U/L — AB (ref 14–54)
AST: 18 U/L (ref 15–41)
Albumin: 4.8 g/dL (ref 3.5–5.0)
Alkaline Phosphatase: 44 U/L (ref 38–126)
Anion gap: 4 — ABNORMAL LOW (ref 5–15)
BILIRUBIN TOTAL: 0.9 mg/dL (ref 0.3–1.2)
BUN: 13 mg/dL (ref 6–20)
CO2: 27 mmol/L (ref 22–32)
CREATININE: 0.79 mg/dL (ref 0.44–1.00)
Calcium: 9.1 mg/dL (ref 8.9–10.3)
Chloride: 107 mmol/L (ref 101–111)
Glucose, Bld: 90 mg/dL (ref 65–99)
Potassium: 3.9 mmol/L (ref 3.5–5.1)
Sodium: 138 mmol/L (ref 135–145)
TOTAL PROTEIN: 7.8 g/dL (ref 6.5–8.1)

## 2015-01-20 LAB — CBC WITH DIFFERENTIAL/PLATELET
BASOS ABS: 0.2 10*3/uL — AB (ref 0–0.1)
Basophils Relative: 2 %
EOS PCT: 3 %
Eosinophils Absolute: 0.2 10*3/uL (ref 0–0.7)
HEMATOCRIT: 41.2 % (ref 35.0–47.0)
Hemoglobin: 13.7 g/dL (ref 12.0–16.0)
LYMPHS ABS: 2.6 10*3/uL (ref 1.0–3.6)
LYMPHS PCT: 32 %
MCH: 29.8 pg (ref 26.0–34.0)
MCHC: 33.3 g/dL (ref 32.0–36.0)
MCV: 89.3 fL (ref 80.0–100.0)
MONO ABS: 0.5 10*3/uL (ref 0.2–0.9)
Monocytes Relative: 7 %
NEUTROS ABS: 4.6 10*3/uL (ref 1.4–6.5)
Neutrophils Relative %: 56 %
Platelets: 205 10*3/uL (ref 150–440)
RBC: 4.61 MIL/uL (ref 3.80–5.20)
RDW: 13.4 % (ref 11.5–14.5)
WBC: 8.2 10*3/uL (ref 3.6–11.0)

## 2015-01-20 LAB — LIPASE, BLOOD: LIPASE: 22 U/L (ref 11–51)

## 2015-01-20 MED ORDER — IOHEXOL 240 MG/ML SOLN
25.0000 mL | Freq: Once | INTRAMUSCULAR | Status: AC | PRN
  Administered 2015-01-20: 25 mL via ORAL

## 2015-01-20 MED ORDER — IOHEXOL 300 MG/ML  SOLN
100.0000 mL | Freq: Once | INTRAMUSCULAR | Status: AC | PRN
  Administered 2015-01-20: 100 mL via INTRAVENOUS

## 2015-01-20 MED ORDER — HYDROCODONE-ACETAMINOPHEN 5-325 MG PO TABS: 1.0000 | ORAL_TABLET | ORAL | Status: DC | PRN

## 2015-01-20 MED ORDER — DOCUSATE SODIUM 100 MG PO CAPS: ORAL_CAPSULE | ORAL | Status: DC

## 2015-01-20 NOTE — Discharge Instructions (Signed)
You have been seen in the Emergency Department (ED) for abdominal pain.  Your evaluation suggests that your pain is a result of a hemorrhagic ovarian cyst, which is very common in generally benign even though it sounds concerning.  Please read through the included information and follow-up both with your GI doctor as scheduled and with a GYN doctor as needed and/or with her primary care physician.  We recommend that you take ibuprofen according to label instructions, or consider taking 600 mg 3 times a day with meals but for no more than 5 days as it may cause some stomach discomfort over an extended period of time.  Take Norco as prescribed for severe pain. Do not drink alcohol, drive or participate in any other potentially dangerous activities while taking this medication as it may make you sleepy. Do not take this medication with any other sedating medications, either prescription or over-the-counter. If you were prescribed Percocet or Vicodin, do not take these with acetaminophen (Tylenol) as it is already contained within these medications.   This medication is an opiate (or narcotic) pain medication and can be habit forming.  Use it as little as possible to achieve adequate pain control.  Do not use or use it with extreme caution if you have a history of opiate abuse or dependence.  If you are on a pain contract with your primary care doctor or a pain specialist, be sure to let them know you were prescribed this medication today from the [facility].  This medication is intended for your use only - do not give any to anyone else and keep it in a secure place where nobody else, especially children, have access to it.  It will also cause or worsen constipation, so you may want to consider taking an over-the-counter stool softener while you are taking this medication.  Please follow up as instructed above regarding todays emergent visit and the symptoms that are bothering  you.  Return to the ED if your abdominal pain worsens or fails to improve, you develop bloody vomiting, bloody diarrhea, you are unable to tolerate fluids due to vomiting, fever greater than 101, or other symptoms that concern you.

## 2015-01-20 NOTE — ED Provider Notes (Signed)
[facility] Emergency Department Provider Note  ____________________________________________  Time seen: Approximately 9:07 AM  I have reviewed the triage vital signs and the nursing notes.   HISTORY  Chief Complaint Abdominal Pain    HPI Gina Hester is a 39 y.o. female who is generally healthy but has a history of diverticulitis diagnosed clinically a couple months ago and treated with antibiotics who presents with abdominal pain and cramping as well as with bloody stools.  The abdominal pain and cramping is severe at worst and mild at best, waxing and waningin intensity.  It has been present for several days.  She states that the bleeding that she sees in her stool has been present ever since her initial bout of diverticulitis.  The diverticulitis occurred about 2 months ago and was diagnosed clinically at an urgent care.  She improved with Cipro and Flagyl, but ever since then she has intermittently seen blood in her stool.    She denies nausea, vomiting, fever/chills, chest pain, shortness of breath.  She states that intermittently she will have constipation and then diarrhea.  She states that her stools are very "thin" in diameter.   Past Medical History  Diagnosis Date  . Medical history non-contributory   . Diverticulitis 2016    There are no active problems to display for this patient.   Past Surgical History  Procedure Laterality Date  . Abdominal hysterectomy    . Sacroiliac joint fusion Left 09/29/2012    Procedure: SACROILIAC JOINT FUSION- left;  Surgeon: Emilee Hero, MD;  Location: [facility] OR;  Service: Orthopedics;  Laterality: Left;  Left sided sacroiliac joint fusion    Current Outpatient Rx  Name  Route  Sig  Dispense  Refill  . naproxen sodium (ALEVE) 220 MG tablet   Oral   Take 220 mg by mouth daily as needed (for pain and/or headache.).         Marland Kitchen piroxicam (FELDENE) 10 MG capsule   Oral   Take 10 mg by mouth 2 (two)  times daily.         Marland Kitchen topiramate (TOPAMAX) 25 MG tablet   Oral   Take 25 mg by mouth 2 (two) times daily.         Marland Kitchen docusate sodium (COLACE) 100 MG capsule      Take 1 tablet once or twice daily as needed for constipation while taking narcotic pain medicine   30 capsule   0   . HYDROcodone-acetaminophen (NORCO/VICODIN) 5-325 MG tablet   Oral   Take 1-2 tablets by mouth every 4 (four) hours as needed for moderate pain.   15 tablet   0     Allergies Peanut-containing drug products; Cefazolin in d5w; and Percocet  History reviewed. No pertinent family history.  Social History Social History  Substance Use Topics  . Smoking status: Never Smoker   . Smokeless tobacco: None  . Alcohol Use: Yes     Comment: occ    Review of Systems Constitutional: No fever/chills Eyes: No visual changes. ENT: No sore throat. Cardiovascular: Denies chest pain. Respiratory: Denies shortness of breath. Gastrointestinal: Abdominal cramping.  No nausea, no vomiting.  Intermittent gross blood in stool. Genitourinary: Negative for dysuria. Musculoskeletal: Negative for back pain. Skin: Negative for rash. Neurological: Negative for headaches, focal weakness or numbness.  10-point ROS otherwise negative.  ____________________________________________   PHYSICAL EXAM:  VITAL SIGNS: ED Triage Vitals  Enc Vitals Group     BP 01/20/15 0901 135/80  mmHg     Pulse Rate 01/20/15 0901 79     Resp --      Temp 01/20/15 0901 98.7 F (37.1 C)     Temp Source 01/20/15 0901 Oral     SpO2 01/20/15 0901 100 %     Weight 01/20/15 0901 160 lb (72.576 kg)     Height 01/20/15 0901 5\' 8"  (1.727 m)     Head Cir --      Peak Flow --      Pain Score --      Pain Loc --      Pain Edu? --      Excl. in GC? --     Constitutional: Alert and oriented. Well appearing and in no acute distress.  Appears mildly uncomfortable Eyes: Conjunctivae are normal. PERRL. EOMI. Head: Atraumatic. Nose: No  congestion/rhinnorhea. Mouth/Throat: Mucous membranes are moist.  Oropharynx non-erythematous. Neck: No stridor.   Cardiovascular: Normal rate, regular rhythm. Grossly normal heart sounds.  Good peripheral circulation. Respiratory: Normal respiratory effort.  No retractions. Lungs CTAB. Gastrointestinal: Soft with moderate generalized tenderness throughout - no specific focality. No distention. No abdominal bruits. No CVA tenderness. Rectal:   Musculoskeletal: No lower extremity tenderness nor edema.  No joint effusions. Neurologic:  Normal speech and language. No gross focal neurologic deficits are appreciated.  Skin:  Skin is warm, dry and intact. No rash noted. Psychiatric: Mood and affect are normal. Speech and behavior are normal.  ____________________________________________   LABS (all labs ordered are listed, but only abnormal results are displayed)  Labs Reviewed  CBC WITH DIFFERENTIAL/PLATELET - Abnormal; Notable for the following:    Basophils Absolute 0.2 (*)    All other components within normal limits  COMPREHENSIVE METABOLIC PANEL - Abnormal; Notable for the following:    ALT 13 (*)    Anion gap 4 (*)    All other components within normal limits  URINALYSIS COMPLETEWITH MICROSCOPIC ([facility] ONLY) - Abnormal; Notable for the following:    Color, Urine STRAW (*)    APPearance CLEAR (*)    Specific Gravity, Urine 1.004 (*)    Squamous Epithelial / LPF 0-5 (*)    All other components within normal limits  LIPASE, BLOOD   ____________________________________________  EKG  Not indicated ____________________________________________  RADIOLOGY   Ct Abdomen Pelvis W Contrast  01/20/2015  CLINICAL DATA:  Pt with diverticulitis in July. Pt states that ever since she has had generalized abd/pelvic pain. Since Thursday pt states that she has not been able to urinate, increased pain while sitting, and stomach distention. EXAM: CT ABDOMEN AND PELVIS WITH CONTRAST TECHNIQUE:  Multidetector CT imaging of the abdomen and pelvis was performed using the standard protocol following bolus administration of intravenous contrast. CONTRAST:  100mL OMNIPAQUE IOHEXOL 300 MG/ML  SOLN COMPARISON:  06/11/2012 FINDINGS: Visualized lung bases clear. Unremarkable liver, nondilated gallbladder, spleen, adrenal glands, kidneys, pancreas, aorta, portal vein. Stomach, small bowel, and colon are nondilated. Normal appendix. Urinary bladder physiologically distended. High attenuation fluid in the pelvis. 3.3 cm right ovarian cystic lesion. Previous hysterectomy. Left ovary unremarkable. No free air. No adenopathy localized. Orthopedic screws across bilateral sacroiliac joints. Disc protrusion L4-5. IMPRESSION: 1. 3.3 cm right ovarian cystic lesion with high-attenuation (probably hemorrhagic) pelvic fluid. Electronically Signed   By: Corlis Leak  Hassell M.D.   On: 01/20/2015 11:50    ____________________________________________   PROCEDURES  Procedure(s) performed: None  Critical Care performed: No ____________________________________________   INITIAL IMPRESSION / ASSESSMENT AND PLAN / ED COURSE  Pertinent labs & imaging results that were available during my care of the patient were reviewed by me and considered in my medical decision making (see chart for details).  Well-appearing, no acute distress, normal vitals, afebrile.  Given the chronicity of the patient's symptoms I doubt we will find an acute cause of her symptoms today, but I will evaluate with CT scan of her abdomen and pelvis.  Labs and urine are unremarkable.  (Note that documentation was delayed due to multiple ED patients requiring immediate care.)   Unremarkable CT scan other than hemorrhagic ovarian cyst.  I give the patient my usual customary return precautions.  There is no indication for further work up at this time.  ____________________________________________  FINAL CLINICAL IMPRESSION(S) / ED DIAGNOSES  Final  diagnoses:  Hemorrhagic ovarian cyst  Generalized abdominal pain      NEW MEDICATIONS STARTED DURING THIS VISIT:  Discharge Medication List as of 01/20/2015 12:25 PM    START taking these medications   Details  docusate sodium (COLACE) 100 MG capsule Take 1 tablet once or twice daily as needed for constipation while taking narcotic pain medicine, Print    HYDROcodone-acetaminophen (NORCO/VICODIN) 5-325 MG tablet Take 1-2 tablets by mouth every 4 (four) hours as needed for moderate pain., Starting 01/20/2015, Until Discontinued, Print         Loleta Rose, MD 01/20/15 [phone]

## 2015-03-07 ENCOUNTER — Ambulatory Visit: Payer: Self-pay | Admitting: Physician Assistant

## 2015-03-07 ENCOUNTER — Encounter: Payer: Self-pay | Admitting: Physician Assistant

## 2015-03-07 VITALS — BP 110/70 | HR 72 | Temp 98.3°F

## 2015-03-07 DIAGNOSIS — J069 Acute upper respiratory infection, unspecified: Secondary | ICD-10-CM

## 2015-03-07 MED ORDER — FLUCONAZOLE 150 MG PO TABS: ORAL_TABLET | ORAL | Status: DC

## 2015-03-07 MED ORDER — AMOXICILLIN 875 MG PO TABS: 875.0000 mg | ORAL_TABLET | Freq: Two times a day (BID) | ORAL | Status: DC

## 2015-03-07 MED ORDER — FLUTICASONE PROPIONATE 50 MCG/ACT NA SUSP: 2.0000 | Freq: Every day | NASAL | Status: DC

## 2015-03-07 NOTE — Progress Notes (Signed)
S: C/o runny nose and congestion for 3 days, no fever, chills, cp/sob, v/d; mucus is green and thick, cough is sporadic, c/o of facial and dental pain. Chest is sore from coughing, multiple coworkers have the same sx  Using otc meds:   O: PE: vitals wnl, nad,  perrl eomi, normocephalic, tms dull, nasal mucosa red and swollen, throat injected, neck supple no lymph, lungs c t a, cv rrr, neuro intact  A:  Acute sinusitis   P: amoxil 875mg  bid x 10d, flonase, drink fluids, continue regular meds , use otc meds of choice, return if not improving in 5 days, return earlier if worsening

## 2015-08-03 ENCOUNTER — Encounter: Payer: Self-pay | Admitting: Physician Assistant

## 2015-08-03 ENCOUNTER — Ambulatory Visit: Payer: Self-pay | Admitting: Physician Assistant

## 2015-08-03 VITALS — BP 118/65 | HR 88 | Temp 99.2°F

## 2015-08-03 DIAGNOSIS — J209 Acute bronchitis, unspecified: Secondary | ICD-10-CM

## 2015-08-03 MED ORDER — LIDOCAINE VISCOUS 2 % MT SOLN: 5.0000 mL | Freq: Four times a day (QID) | OROMUCOSAL | Status: DC | PRN

## 2015-08-03 MED ORDER — AZITHROMYCIN 250 MG PO TABS: ORAL_TABLET | ORAL | Status: DC

## 2015-08-03 MED ORDER — PSEUDOEPH-BROMPHEN-DM 30-2-10 MG/5ML PO SYRP: 5.0000 mL | ORAL_SOLUTION | Freq: Four times a day (QID) | ORAL | Status: DC | PRN

## 2015-08-03 NOTE — Progress Notes (Signed)
   Subjective:cough/sore throat/fever    Patient ID: Gina KoyanagiJennifer D Hester, female    DOB: November 20, 1975, 40 y.o.   MRN: [id number]  HPI Patient c/o one week of sore throat, post nasal drainage and productive cough. States nausea without vomiting. No palliative measure for compliant. Patient has history of allergic rhinitis. States mild fever/chills and body ache. Review of Systems    Negative except for compliant. Objective:   Physical Exam HEENT for edematous nasal turbinates, post nasal drainage, erythematous pharynx and productive cough.  Neck supple. Lungs with Rales.  Heart RRR.       Assessment & Plan:Bronchitis  Zithromax, Bromfed DM, and Viscous Lidocaine as directed.  Take Tylenol/Ibuprofen for fever/body ache. Follow up 3 days if no improvement.

## 2016-02-05 ENCOUNTER — Ambulatory Visit: Payer: Self-pay | Admitting: Physician Assistant

## 2016-02-11 ENCOUNTER — Encounter: Payer: Self-pay | Admitting: Physician Assistant

## 2016-02-11 ENCOUNTER — Ambulatory Visit: Payer: Self-pay | Admitting: Physician Assistant

## 2016-02-11 VITALS — BP 120/80 | HR 64 | Temp 98.7°F

## 2016-02-11 DIAGNOSIS — J069 Acute upper respiratory infection, unspecified: Secondary | ICD-10-CM

## 2016-02-11 MED ORDER — AMOXICILLIN-POT CLAVULANATE 875-125 MG PO TABS: 1.0000 | ORAL_TABLET | Freq: Two times a day (BID) | ORAL | 0 refills | Status: DC

## 2016-02-11 NOTE — Progress Notes (Signed)
S: C/o cough and congestion with chest tightness, chest is sore from coughing, denies fever, chills; unable to get mucus out but can taste it in the back of her mouth, voice is hoarse, completely lost it 2 days ago and its now getting better,  denies cardiac type chest pain or sob, v/d, abd pain; was seen at urgent care 6 days ago, flu and strep test neg, given prednisone and tessalon perls, flonase Remainder ros neg  O: vitals wnl, nad, tms clear, throat injected, neck supple no lymph, lungs with wheezing, cv rrr, neuro intact  A:  Acute bronchitis, uri   P:  rx medication: augmentin,  use otc meds, tylenol or motrin as needed for fever/chills, return if not better in 3 -5 days, return earlier if worsening, continue flonase and tessalon perls as needed

## 2016-02-21 ENCOUNTER — Telehealth: Payer: Self-pay | Admitting: Emergency Medicine

## 2016-02-21 MED ORDER — FLUCONAZOLE 150 MG PO TABS: 150.0000 mg | ORAL_TABLET | Freq: Once | ORAL | 0 refills | Status: AC

## 2016-02-21 NOTE — Telephone Encounter (Signed)
Pt states she got a yeast infection from the amoxil, called in [facility]

## 2016-02-21 NOTE — Telephone Encounter (Signed)
Patient has finished the Amoxicillin and has developed a yeast infection.  Wants D[facility] [place].

## 2016-04-18 ENCOUNTER — Ambulatory Visit: Payer: Self-pay | Admitting: Physician Assistant

## 2016-04-18 ENCOUNTER — Encounter: Payer: Self-pay | Admitting: Physician Assistant

## 2016-04-18 VITALS — BP 122/78 | HR 72 | Temp 98.4°F | Ht 67.0 in | Wt 181.0 lb

## 2016-04-18 DIAGNOSIS — Z0000 Encounter for general adult medical examination without abnormal findings: Secondary | ICD-10-CM

## 2016-04-18 DIAGNOSIS — R5383 Other fatigue: Secondary | ICD-10-CM

## 2016-04-18 NOTE — Progress Notes (Signed)
S: states she is having weight gain and fatigue, her mother has thyroid issues and ?if that's causing her to feel this way, denies fever/chills/cp/sob/abd pain; takes topiramate for back pain,  nonsmoker, allergies as listed on chart, ros neg, fam hx as listed on chart  O: vitals wnl, nad, ENT wnl, neck supple no lymph, thyroid gland is smooth, lungs c t a, cv rrr, abd soft nontender bs normal all 4 quads, will defer pelvic to gyn  A: wellness encounter, biometric form, fatigue  P: labs today, biometric form filled out, pt to make appt with gyn for yearly exam

## 2016-04-20 LAB — CMP12+LP+TP+TSH+6AC+CBC/D/PLT
A/G RATIO: 1.6 (ref 1.2–2.2)
ALK PHOS: 45 IU/L (ref 39–117)
ALT: 11 IU/L (ref 0–32)
AST: 11 IU/L (ref 0–40)
Albumin: 4.1 g/dL (ref 3.5–5.5)
BASOS: 1 %
BUN/Creatinine Ratio: 17 (ref 9–23)
BUN: 12 mg/dL (ref 6–20)
Basophils Absolute: 0.1 10*3/uL (ref 0.0–0.2)
Bilirubin Total: 0.3 mg/dL (ref 0.0–1.2)
CHOL/HDL RATIO: 4.1 ratio (ref 0.0–4.4)
Calcium: 9.2 mg/dL (ref 8.7–10.2)
Chloride: 104 mmol/L (ref 96–106)
Cholesterol, Total: 184 mg/dL (ref 100–199)
Creatinine, Ser: 0.72 mg/dL (ref 0.57–1.00)
EOS (ABSOLUTE): 0.2 10*3/uL (ref 0.0–0.4)
EOS: 3 %
Estimated CHD Risk: 0.9 times avg. (ref 0.0–1.0)
Free Thyroxine Index: 1.8 (ref 1.2–4.9)
GFR calc non Af Amer: 106 mL/min/{1.73_m2} (ref >59)
GFR, EST AFRICAN AMERICAN: 122 mL/min/{1.73_m2} (ref >59)
GGT: 13 IU/L (ref 0–60)
GLOBULIN, TOTAL: 2.5 g/dL (ref 1.5–4.5)
GLUCOSE: 94 mg/dL (ref 65–99)
HDL: 45 mg/dL (ref >39)
HEMATOCRIT: 41.6 % (ref 34.0–46.6)
HEMOGLOBIN: 13.6 g/dL (ref 11.1–15.9)
IMMATURE GRANS (ABS): 0 10*3/uL (ref 0.0–0.1)
Immature Granulocytes: 0 %
Iron: 65 ug/dL (ref 27–159)
LDH: 137 IU/L (ref 119–226)
LDL CALC: 121 mg/dL — AB (ref 0–99)
LYMPHS: 35 %
Lymphocytes Absolute: 2.6 10*3/uL (ref 0.7–3.1)
MCH: 29.1 pg (ref 26.6–33.0)
MCHC: 32.7 g/dL (ref 31.5–35.7)
MCV: 89 fL (ref 79–97)
MONOCYTES: 7 %
Monocytes Absolute: 0.5 10*3/uL (ref 0.1–0.9)
NEUTROS ABS: 4.1 10*3/uL (ref 1.4–7.0)
Neutrophils: 54 %
POTASSIUM: 5.4 mmol/L — AB (ref 3.5–5.2)
Phosphorus: 3.8 mg/dL (ref 2.5–4.5)
Platelets: 262 10*3/uL (ref 150–379)
RBC: 4.67 x10E6/uL (ref 3.77–5.28)
RDW: 13.9 % (ref 12.3–15.4)
Sodium: 143 mmol/L (ref 134–144)
T3 Uptake Ratio: 27 % (ref 24–39)
T4, Total: 6.5 ug/dL (ref 4.5–12.0)
TRIGLYCERIDES: 88 mg/dL (ref 0–149)
TSH: 1.16 u[IU]/mL (ref 0.450–4.500)
Total Protein: 6.6 g/dL (ref 6.0–8.5)
URIC ACID: 4.8 mg/dL (ref 2.5–7.1)
VLDL CHOLESTEROL CAL: 18 mg/dL (ref 5–40)
WBC: 7.5 10*3/uL (ref 3.4–10.8)

## 2016-04-20 LAB — B12 AND FOLATE PANEL
Folate: 9.7 ng/mL (ref >3.0)
Vitamin B-12: 359 pg/mL (ref 232–1245)

## 2016-04-20 LAB — EPSTEIN-BARR VIRUS VCA ANTIBODY PANEL
EBV EARLY ANTIGEN AB, IGG: 13.9 U/mL — AB (ref 0.0–8.9)
EBV VCA IgG: 401 U/mL — ABNORMAL HIGH (ref 0.0–17.9)
EBV VCA IgM: <36 U/mL (ref 0.0–35.9)

## 2016-04-20 LAB — VITAMIN D 25 HYDROXY (VIT D DEFICIENCY, FRACTURES): Vit D, 25-Hydroxy: 49.5 ng/mL (ref 30.0–100.0)

## 2016-05-23 ENCOUNTER — Encounter: Payer: Self-pay | Admitting: Emergency Medicine

## 2016-09-02 ENCOUNTER — Other Ambulatory Visit: Payer: Self-pay | Admitting: Obstetrics and Gynecology

## 2016-09-02 DIAGNOSIS — Z1231 Encounter for screening mammogram for malignant neoplasm of breast: Secondary | ICD-10-CM

## 2016-09-17 ENCOUNTER — Ambulatory Visit
Admission: RE | Admit: 2016-09-17 | Discharge: 2016-09-17 | Disposition: A | Discharge status: Home / Self Care | Payer: Managed Care, Other (non HMO) | Source: Ambulatory Visit | Attending: Obstetrics and Gynecology | Admitting: Obstetrics and Gynecology

## 2016-09-17 DIAGNOSIS — Z1231 Encounter for screening mammogram for malignant neoplasm of breast: Secondary | ICD-10-CM

## 2016-10-29 ENCOUNTER — Encounter: Payer: Self-pay | Admitting: Physician Assistant

## 2016-10-29 ENCOUNTER — Ambulatory Visit: Payer: Self-pay | Admitting: Physician Assistant

## 2016-10-29 VITALS — BP 115/70 | HR 73 | Temp 98.5°F | Resp 16

## 2016-10-29 DIAGNOSIS — M7711 Lateral epicondylitis, right elbow: Secondary | ICD-10-CM

## 2016-10-29 MED ORDER — NAPROXEN 500 MG PO TBEC: 500.0000 mg | DELAYED_RELEASE_TABLET | Freq: Two times a day (BID) | ORAL | 3 refills | Status: DC

## 2016-10-29 NOTE — Progress Notes (Signed)
S: c/o r elbow pain for about 1-2 weeks, bought a tennis elbow brace and it really helped at first, now its hurting more when she takes the brace off, still able to grab for her gun, pain with twisting off jar tops  O: vitals wnl, nad, skin intact, elbow tender at lateral epicondyle, pain reproduced with rotation of forearm, +spasm in lower forearm, grips = b/l, able to reach behind her a grab sidearm without difficulty, n/v intact  A: tennis elbow  P: naproxen 500mg  bid , wet heat, massage and tens unit, follow with ice

## 2016-11-18 ENCOUNTER — Telehealth: Payer: Self-pay | Admitting: Physician Assistant

## 2016-11-18 MED ORDER — DICLOFENAC SODIUM 75 MG PO TBEC: 75.0000 mg | DELAYED_RELEASE_TABLET | Freq: Two times a day (BID) | ORAL | 3 refills | Status: DC

## 2016-11-18 NOTE — Telephone Encounter (Signed)
Refer her to ortho, doesn't matter which one.  I will call in diclofenac

## 2016-12-19 ENCOUNTER — Telehealth: Payer: Self-pay | Admitting: Physician Assistant

## 2017-01-25 ENCOUNTER — Emergency Department: Payer: Managed Care, Other (non HMO)

## 2017-01-25 ENCOUNTER — Encounter: Payer: Self-pay | Admitting: Emergency Medicine

## 2017-01-25 ENCOUNTER — Emergency Department
Admission: EM | Admit: 2017-01-25 | Discharge: 2017-01-25 | Disposition: A | Discharge status: Home / Self Care | Payer: Managed Care, Other (non HMO) | Attending: Emergency Medicine | Admitting: Emergency Medicine

## 2017-01-25 DIAGNOSIS — Y999 Unspecified external cause status: Secondary | ICD-10-CM | POA: Diagnosis not present

## 2017-01-25 DIAGNOSIS — Y929 Unspecified place or not applicable: Secondary | ICD-10-CM | POA: Diagnosis not present

## 2017-01-25 DIAGNOSIS — Z91010 Allergy to peanuts: Secondary | ICD-10-CM | POA: Insufficient documentation

## 2017-01-25 DIAGNOSIS — R51 Headache: Secondary | ICD-10-CM | POA: Insufficient documentation

## 2017-01-25 DIAGNOSIS — Z885 Allergy status to narcotic agent status: Secondary | ICD-10-CM | POA: Diagnosis not present

## 2017-01-25 DIAGNOSIS — S060X0A Concussion without loss of consciousness, initial encounter: Secondary | ICD-10-CM

## 2017-01-25 DIAGNOSIS — W228XXA Striking against or struck by other objects, initial encounter: Secondary | ICD-10-CM | POA: Insufficient documentation

## 2017-01-25 DIAGNOSIS — R112 Nausea with vomiting, unspecified: Secondary | ICD-10-CM | POA: Insufficient documentation

## 2017-01-25 DIAGNOSIS — Z79899 Other long term (current) drug therapy: Secondary | ICD-10-CM | POA: Diagnosis not present

## 2017-01-25 DIAGNOSIS — S0990XA Unspecified injury of head, initial encounter: Secondary | ICD-10-CM | POA: Diagnosis present

## 2017-01-25 DIAGNOSIS — Y93K3 Activity, grooming and shearing an animal: Secondary | ICD-10-CM | POA: Insufficient documentation

## 2017-01-25 LAB — COMPREHENSIVE METABOLIC PANEL
ALK PHOS: 49 U/L (ref 38–126)
ALT: 19 U/L (ref 14–54)
AST: 21 U/L (ref 15–41)
Albumin: 3.8 g/dL (ref 3.5–5.0)
Anion gap: 9 (ref 5–15)
BILIRUBIN TOTAL: 0.7 mg/dL (ref 0.3–1.2)
BUN: 16 mg/dL (ref 6–20)
CALCIUM: 8.6 mg/dL — AB (ref 8.9–10.3)
CO2: 24 mmol/L (ref 22–32)
CREATININE: 0.76 mg/dL (ref 0.44–1.00)
Chloride: 105 mmol/L (ref 101–111)
GFR calc Af Amer: >60 mL/min (ref >60)
GFR calc non Af Amer: >60 mL/min (ref >60)
GLUCOSE: 115 mg/dL — AB (ref 65–99)
Potassium: 3.4 mmol/L — ABNORMAL LOW (ref 3.5–5.1)
Sodium: 138 mmol/L (ref 135–145)
TOTAL PROTEIN: 6.8 g/dL (ref 6.5–8.1)

## 2017-01-25 LAB — CBC
HCT: 38.5 % (ref 35.0–47.0)
Hemoglobin: 12.9 g/dL (ref 12.0–16.0)
MCH: 29.1 pg (ref 26.0–34.0)
MCHC: 33.3 g/dL (ref 32.0–36.0)
MCV: 87.4 fL (ref 80.0–100.0)
Platelets: 264 10*3/uL (ref 150–440)
RBC: 4.41 MIL/uL (ref 3.80–5.20)
RDW: 13.4 % (ref 11.5–14.5)
WBC: 9.1 10*3/uL (ref 3.6–11.0)

## 2017-01-25 LAB — LIPASE, BLOOD: Lipase: 26 U/L (ref 11–51)

## 2017-01-25 MED ORDER — ONDANSETRON 4 MG PO TBDP
4.0000 mg | ORAL_TABLET | Freq: Once | ORAL | Status: AC | PRN
  Administered 2017-01-25: 4 mg via ORAL
  Filled 2017-01-25: qty 1

## 2017-01-25 MED ORDER — ONDANSETRON HCL 4 MG PO TABS
4.0000 mg | ORAL_TABLET | Freq: Once | ORAL | Status: AC
  Administered 2017-01-25: 4 mg via ORAL
  Filled 2017-01-25: qty 1

## 2017-01-25 MED ORDER — ONDANSETRON HCL 4 MG PO TABS: 4.0000 mg | ORAL_TABLET | Freq: Three times a day (TID) | ORAL | 0 refills | Status: DC | PRN

## 2017-01-25 MED ORDER — ACETAMINOPHEN 325 MG PO TABS
650.0000 mg | ORAL_TABLET | Freq: Once | ORAL | Status: AC
  Administered 2017-01-25: 650 mg via ORAL
  Filled 2017-01-25: qty 2

## 2017-01-25 NOTE — ED Notes (Signed)
Pt given meds. Pt sipping on ginger ale.

## 2017-01-25 NOTE — ED Provider Notes (Signed)
[facility] Emergency Department Provider Note  ____________________________________________   First MD Initiated Contact with Patient 01/25/17 1605     (approximate)  I have reviewed the triage vital signs and the nursing notes.   HISTORY  Chief Complaint Head Injury and Emesis   HPI Gina Hester is a 41 y.o. female with a history of diverticulitis who is presenting to the emergency department with a headache, photophobia with nausea and vomiting.  The patient says that she was cutting her dog's hair last night when she stood up and hit the right temple against a cabinet.  She did not lose consciousness but said that the impact was very hard and she has been having pain to the right as well as left side of her head since.  She has had multiple episodes of nausea and vomiting with some difficulty with her balance ever since.  She does not take any blood thinners.  Was given Zofran in triage but then vomited afterwards.  Was given the ODT form at that time.  Does not report any weakness or numbness.  Past Medical History:  Diagnosis Date  . Diverticulitis 2016  . Medical history non-contributory     There are no active problems to display for this patient.   Past Surgical History:  Procedure Laterality Date  . ABDOMINAL HYSTERECTOMY      Prior to Admission medications   Medication Sig Start Date End Date Taking? Authorizing Provider  diclofenac (VOLTAREN) 75 MG EC tablet Take 1 tablet (75 mg total) by mouth 2 (two) times daily. 11/18/16   Faythe Ghee, PA-C  naproxen (EC NAPROSYN) 500 MG EC tablet Take 1 tablet (500 mg total) by mouth 2 (two) times daily with a meal. 10/29/16   Fisher, Roselyn Bering, PA-C  topiramate (TOPAMAX) 25 MG tablet Take 25 mg by mouth 2 (two) times daily.    [provider]    Allergies Peanut oil; Peanut-containing drug products; Cefazolin sodium-dextrose; and Percocet [oxycodone-acetaminophen]  Family History    Problem Relation Age of Onset  . Diabetes Mother   . Hypothyroidism Mother   . Diabetes Father   . Hypertension Father   . Hyperlipidemia Father   . Breast cancer Neg Hx     Social History Social History   Tobacco Use  . Smoking status: Never Smoker  . Smokeless tobacco: Never Used  Substance Use Topics  . Alcohol use: Yes    Comment: occ  . Drug use: No    Review of Systems  Constitutional: No fever/chills Eyes: No visual changes. ENT: No sore throat. Cardiovascular: Denies chest pain. Respiratory: Denies shortness of breath. Gastrointestinal: No abdominal pain.    No diarrhea.  No constipation. Genitourinary: Negative for dysuria. Musculoskeletal: Negative for back pain. Skin: Negative for rash. Neurological: Negative for focal weakness or numbness.   ____________________________________________   PHYSICAL EXAM:  VITAL SIGNS: ED Triage Vitals [01/25/17 1417]  Enc Vitals Group     BP 138/82     Pulse Rate 74     Resp 16     Temp (!) 97.5 F (36.4 C)     Temp Source Oral     SpO2 98 %     Weight      Height      Head Circumference      Peak Flow      Pain Score 7     Pain Loc      Pain Edu?  Excl. in GC?     Constitutional: Alert and oriented. Well appearing and in no acute distress. Eyes: Conjunctivae are normal.  Head: Atraumatic.  Very mild tenderness over the right temple without any depression, swelling or bogginess. Nose: No congestion/rhinnorhea. Mouth/Throat: Mucous membranes are moist.  Neck: No stridor.   Cardiovascular: Normal rate, regular rhythm. Grossly normal heart sounds.   Respiratory: Normal respiratory effort.  No retractions. Lungs CTAB. Gastrointestinal: Soft and nontender. No distention.  Musculoskeletal: No lower extremity tenderness nor edema.  No joint effusions. Neurologic:  Normal speech and language. No gross focal neurologic deficits are appreciated. Skin:  Skin is warm, dry and intact. No rash  noted. Psychiatric: Mood and affect are normal. Speech and behavior are normal.  ____________________________________________   LABS (all labs ordered are listed, but only abnormal results are displayed)  Labs Reviewed  COMPREHENSIVE METABOLIC PANEL - Abnormal; Notable for the following components:      Result Value   Potassium 3.4 (*)    Glucose, Bld 115 (*)    Calcium 8.6 (*)    All other components within normal limits  LIPASE, BLOOD  CBC  URINALYSIS, COMPLETE (UACMP) WITH MICROSCOPIC   ____________________________________________  EKG   ____________________________________________  RADIOLOGY  Head CT without any acute findings ____________________________________________   PROCEDURES  Procedure(s) performed:   Procedures  Critical Care performed:   ____________________________________________   INITIAL IMPRESSION / ASSESSMENT AND PLAN / ED COURSE  Pertinent labs & imaging results that were available during my care of the patient were reviewed by me and considered in my medical decision making (see chart for details).  Differential diagnosis includes, but is not limited to, intracranial hemorrhage, meningitis/encephalitis, previous head trauma, cavernous venous thrombosis, tension headache, temporal arteritis, migraine or migraine equivalent, idiopathic intracranial hypertension, and non-specific headache.  Concussion  As part of my medical decision making, I reviewed the following data within the electronic MEDICAL RECORD NUMBER Notes from prior ED visits  ----------------------------------------- 6:04 PM on 01/25/2017 -----------------------------------------  Patient was given p.o. Zofran and now is tolerating fluids.  However, she says that her headache is persistent despite Tylenol and ibuprofen at home.  I recommended brain rest as well as making sure not to reinjure her head.  She is understanding of the plan willing to comply.  She will be discharged at  this time.          ____________________________________________   FINAL CLINICAL IMPRESSION(S) / ED DIAGNOSES  Concussion    NEW MEDICATIONS STARTED DURING THIS VISIT:  This SmartLink is deprecated. Use AVSMEDLIST instead to display the medication list for a patient.   Note:  This document was prepared using Dragon voice recognition software and may include unintentional dictation errors.     Myrna BlazerSchaevitz, Dianelys Scinto Matthew, MD 01/25/17 [phone]

## 2017-01-25 NOTE — ED Triage Notes (Signed)
Pt states that last night she hit her head on a cabinet door, pt states that she has had pain in the area since, today pt started vomiting, states that she has vomiting 3 times. Pt states that coughing makes the pain in her head worse. Pt is in NAD at this time.

## 2017-02-04 ENCOUNTER — Encounter: Payer: Self-pay | Admitting: Physician Assistant

## 2017-02-04 ENCOUNTER — Ambulatory Visit: Payer: Self-pay | Admitting: Physician Assistant

## 2017-02-04 VITALS — BP 120/70 | HR 65 | Temp 98.5°F | Resp 16

## 2017-02-04 DIAGNOSIS — F0781 Postconcussional syndrome: Secondary | ICD-10-CM

## 2017-02-04 MED ORDER — TRIAMCINOLONE ACETONIDE 0.1 % EX CREA: 1.0000 "application " | TOPICAL_CREAM | Freq: Two times a day (BID) | CUTANEOUS | 6 refills | Status: DC

## 2017-02-04 MED ORDER — MECLIZINE HCL 25 MG PO TABS: 25.0000 mg | ORAL_TABLET | Freq: Three times a day (TID) | ORAL | 0 refills | Status: DC | PRN

## 2017-02-04 NOTE — Patient Instructions (Addendum)
Post-Concussion Syndrome Post-concussion syndrome is the symptoms that can occur after a head injury. These symptoms can last from weeks to months. Follow these instructions at home:  Take medicines only as told by your doctor.  Do not take aspirin.  Sleep with your head raised to help with headaches.  Avoid activities that can cause another head injury. ? Do not play contact sports like football, hockey, soccer, or basketball. ? Do not do other risky activities like downhill skiing, martial arts, or horseback riding until your doctor says it is okay.  Keep all follow-up visits as told by your doctor. This is important. Contact a doctor if:  You have a harder time: ? Paying attention. ? Focusing. ? Remembering. ? Learning new information. ? Dealing with stress.  You need more time to complete tasks.  You are easily bothered (irritable).  You have more symptoms. Get help if you have any of these symptoms for more than two weeks after your injury:  Long-lasting (chronic) headaches.  Dizziness.  Trouble balancing.  Feeling sick to your stomach (nauseous).  Trouble with your vision.  Noise or light bothers you more.  Depression.  Mood swings.  Feeling worried (anxious).  Easily bothered.  Memory problems.  Trouble concentrating or paying attention.  Sleep problems.  Feeling tired all of the time.  Get help right away if:  You feel confused.  You feel very sleepy.  You are hard to wake up.  You feel sick to your stomach.  You keep throwing up (vomiting).  You feel like you are moving when you are not (vertigo).  Your eyes move back and forth very quickly.  You start shaking (convulsing) or pass out (faint).  You have very bad headaches that do not get better with medicine.  You cannot use your arms or legs like normal.  One of the black centers of your eyes (pupils) is bigger than the other.  You have clear or bloody fluid coming from your  nose or ears.  Your problems get worse, not better. This information is not intended to replace advice given to you by your health care provider. Make sure you discuss any questions you have with your health care provider. Document Released: 04/17/2004 Document Revised: 08/16/2015 Document Reviewed: 06/15/2013 Elsevier Interactive Patient Education  2018 [employer] Inc[employer]  

## 2017-02-04 NOTE — Progress Notes (Signed)
S: Patient was seen in the emergency department after hitting the right side of her head on, states she was diagnosed with a concussion, states that the headaches have gotten better over the past week, states that she still has some dizziness, denies vomiting, states she is taking Tylenol for the headache as needed, states it makes her head hurt to look at her computer screen for to long, the CT of her head was negative 2. Complains that her hands are dry and cracking, states it happens every winter O: Vitals are normal, pupils are equal round and reactive to light, extraocular motion shows 3 beats of nystagmus on the lateral plane, grips are equal bilaterally, cranial nerves II through XII are grossly intact, skin on hands is rough to touch and crack to the knuckles  A: Postconcussion syndrome, dry skin  P: Continue to take over-the-counter Tylenol, a prescription for Antivert was given, triamcinolone cream 0.1% apply twice a day followed by a lotion such as Cetaphil or udder cream

## 2017-04-02 ENCOUNTER — Ambulatory Visit: Payer: Self-pay | Admitting: Medical

## 2017-04-02 VITALS — BP 120/70 | HR 81 | Temp 98.4°F | Resp 16

## 2017-04-02 DIAGNOSIS — B349 Viral infection, unspecified: Secondary | ICD-10-CM

## 2017-04-02 LAB — POCT INFLUENZA A/B
Influenza A, POC: NEGATIVE
Influenza B, POC: NEGATIVE

## 2017-04-02 NOTE — Addendum Note (Signed)
Addended by: RATCLIFFE, Herbert SetaHEATHER R on: 04/02/2017 09:53 AM   Modules accepted: Orders

## 2017-04-02 NOTE — Patient Instructions (Signed)
Rest , fluids, OTC Zyrtec or Claritin take as directed. OTC Flonase take as directed.   Viral Illness, Adult Viruses are tiny germs that can get into a person's body and cause illness. There are many different types of viruses, and they cause many types of illness. Viral illnesses can range from mild to severe. They can affect various parts of the body. Common illnesses that are caused by a virus include colds and the flu. Viral illnesses also include serious conditions such as HIV/AIDS (human immunodeficiency virus/acquired immunodeficiency syndrome). A few viruses have been linked to certain cancers. What are the causes? Many types of viruses can cause illness. Viruses invade cells in your body, multiply, and cause the infected cells to malfunction or die. When the cell dies, it releases more of the virus. When this happens, you develop symptoms of the illness, and the virus continues to spread to other cells. If the virus takes over the function of the cell, it can cause the cell to divide and grow out of control, as is the case when a virus causes cancer. Different viruses get into the body in different ways. You can get a virus by:  Swallowing food or water that is contaminated with the virus.  Breathing in droplets that have been coughed or sneezed into the air by an infected person.  Touching a surface that has been contaminated with the virus and then touching your eyes, nose, or mouth.  Being bitten by an insect or animal that carries the virus.  Having sexual contact with a person who is infected with the virus.  Being exposed to blood or fluids that contain the virus, either through an open cut or during a transfusion.  If a virus enters your body, your body's defense system (immune system) will try to fight the virus. You may be at higher risk for a viral illness if your immune system is weak. What are the signs or symptoms? Symptoms vary depending on the type of virus and the  location of the cells that it invades. Common symptoms of the main types of viral illnesses include: Cold and flu viruses  Fever.  Headache.  Sore throat.  Muscle aches.  Nasal congestion.  Cough. Digestive system (gastrointestinal) viruses  Fever.  Abdominal pain.  Nausea.  Diarrhea. Liver viruses (hepatitis)  Loss of appetite.  Tiredness.  Yellowing of the skin (jaundice). Brain and spinal cord viruses  Fever.  Headache.  Stiff neck.  Nausea and vomiting.  Confusion or sleepiness. Skin viruses  Warts.  Itching.  Rash. Sexually transmitted viruses  Discharge.  Swelling.  Redness.  Rash. How is this treated? Viruses can be difficult to treat because they live within cells. Antibiotic medicines do not treat viruses because these drugs do not get inside cells. Treatment for a viral illness may include:  Resting and drinking plenty of fluids.  Medicines to relieve symptoms. These can include over-the-counter medicine for pain and fever, medicines for cough or congestion, and medicines to relieve diarrhea.  Antiviral medicines. These drugs are available only for certain types of viruses. They may help reduce flu symptoms if taken early. There are also many antiviral medicines for hepatitis and HIV/AIDS.  Some viral illnesses can be prevented with vaccinations. A common example is the flu shot. Follow these instructions at home: Medicines   Take over-the-counter and prescription medicines only as told by your health care provider.  If you were prescribed an antiviral medicine, take it as told by your health care  provider. Do not stop taking the medicine even if you start to feel better.  Be aware of when antibiotics are needed and when they are not needed. Antibiotics do not treat viruses. If your health care provider thinks that you may have a bacterial infection as well as a viral infection, you may get an antibiotic. ? Do not ask for an  antibiotic prescription if you have been diagnosed with a viral illness. That will not make your illness go away faster. ? Frequently taking antibiotics when they are not needed can lead to antibiotic resistance. When this develops, the medicine no longer works against the bacteria that it normally fights. General instructions  Drink enough fluids to keep your urine clear or pale yellow.  Rest as much as possible.  Return to your normal activities as told by your health care provider. Ask your health care provider what activities are safe for you.  Keep all follow-up visits as told by your health care provider. This is important. How is this prevented? Take these actions to reduce your risk of viral infection:  Eat a healthy diet and get enough rest.  Wash your hands often with soap and water. This is especially important when you are in public places. If soap and water are not available, use hand sanitizer.  Avoid close contact with friends and family who have a viral illness.  If you travel to areas where viral gastrointestinal infection is common, avoid drinking water or eating raw food.  Keep your immunizations up to date. Get a flu shot every year as told by your health care provider.  Do not share toothbrushes, nail clippers, razors, or needles with other people.  Always practice safe sex.  Contact a health care provider if:  You have symptoms of a viral illness that do not go away.  Your symptoms come back after going away.  Your symptoms get worse. Get help right away if:  You have trouble breathing.  You have a severe headache or a stiff neck.  You have severe vomiting or abdominal pain. This information is not intended to replace advice given to you by your health care provider. Make sure you discuss any questions you have with your health care provider. Document Released: 07/20/2015 Document Revised: 08/22/2015 Document Reviewed: 07/20/2015 Elsevier Interactive  Patient Education  [employer].

## 2017-04-02 NOTE — Progress Notes (Addendum)
   Subjective:    Patient ID: Gina Hester, female    DOB: March 11, 1976, 42 y.o.   MRN: [id number]  HPI 42 yo female in non acute distress with nonproductive cough, body aches , nasal congestion with clear discharge, fever and chills.  Slight abdominal pain with diarrhea last night and this morning , some nausea but no vomiting . Intially thought it was in the chest took Mucinex and that seemed to clear up. Now it seems in the nose and head. Mild right ear pain. No flu vaccine this year.   Review of Systems  Constitutional: Positive for chills and fever.  HENT: Positive for congestion and ear pain (right). Negative for sore throat.   Eyes: Negative for discharge and itching.  Respiratory: Positive for cough. Negative for shortness of breath.   Cardiovascular: Negative for chest pain.  Gastrointestinal: Positive for abdominal pain, diarrhea and nausea.  Genitourinary: Negative for dysuria.  Musculoskeletal: Positive for myalgias.  Neurological: Negative for dizziness, syncope, light-headedness and headaches.  Hematological: Positive for adenopathy.       Objective:   Physical Exam  Constitutional: She is oriented to person, place, and time. She appears well-developed and well-nourished.  HENT:  Head: Normocephalic and atraumatic.  Right Ear: Hearing, external ear and ear canal normal. A middle ear effusion is present.  Left Ear: Hearing, external ear and ear canal normal. A middle ear effusion is present.  Nose: Mucosal edema and rhinorrhea present.  Mouth/Throat: Uvula is midline and mucous membranes are normal. Posterior oropharyngeal edema (mild) present.  Eyes: Conjunctivae and EOM are normal. Pupils are equal, round, and reactive to light.  Neck: Normal range of motion. Neck supple.  Cardiovascular: Normal rate, regular rhythm and normal heart sounds. Exam reveals no gallop and no friction rub.  No murmur heard. Pulmonary/Chest: Effort normal and breath sounds normal.   Lymphadenopathy:    She has cervical adenopathy.  Neurological: She is alert and oriented to person, place, and time.  Skin: Skin is warm and dry.  Psychiatric: She has a normal mood and affect. Her behavior is normal. Judgment and thought content normal.  Nursing note and vitals reviewed.   Flu test Negative      Assessment & Plan:  Cough/ Viral infection OTC Zyrtec or Claritin , OTC Flonase take as directed. Declines cough medication. Rest and Increase fluids. Return to clinic if mucus changes color or fever 101 or higher or sicker longer than 10 days. Patient verbalizes understanding and has no questions at discharge.

## 2017-06-15 ENCOUNTER — Ambulatory Visit: Payer: Self-pay | Admitting: Family Medicine

## 2017-06-15 ENCOUNTER — Encounter: Payer: Self-pay | Admitting: Family Medicine

## 2017-06-15 VITALS — BP 120/82 | HR 91 | Temp 98.3°F | Wt 194.0 lb

## 2017-06-15 DIAGNOSIS — R197 Diarrhea, unspecified: Secondary | ICD-10-CM

## 2017-06-15 DIAGNOSIS — K529 Noninfective gastroenteritis and colitis, unspecified: Secondary | ICD-10-CM

## 2017-06-15 MED ORDER — LEVOFLOXACIN 500 MG PO TABS: 500.0000 mg | ORAL_TABLET | Freq: Every day | ORAL | 0 refills | Status: AC

## 2017-06-15 MED ORDER — DICYCLOMINE HCL 10 MG PO CAPS: 10.0000 mg | ORAL_CAPSULE | Freq: Two times a day (BID) | ORAL | 0 refills | Status: DC

## 2017-06-15 NOTE — Patient Instructions (Signed)
Shigellosis, Adult Shigellosis is an infection of the intestines that can cause diarrhea, abdominal pain, and other symptoms. The infection usually lasts 5-7 days. What are the causes? This infection is caused by Shigella bacteria. These bacteria can spread through a person's stool. You can get this infection by:  Eating or drinking something that has the bacteria on it.  Touching something that has the bacteria on it and putting your hand in your mouth.  Swimming in water that has the bacteria in it.  What are the signs or symptoms? Symptoms of this infection may start 2-7 days after the bacteria gets into your body. Symptoms include:  Diarrhea, commonly with blood, mucus, or pus.  Abdominal pain or cramps.  Fever.  Nausea.  Vomiting.  Loss of appetite.  Rectal spasms (tenesmus).  Rectum protruding out of the body (rectal prolapse).  How is this diagnosed? This infection may be diagnosed with a medical history, physical exam, and stool test. How is this treated? Often, the only treatment needed is to drink plenty of fluids. Drinking fluids is important because this infection can make you dehydrated. If your infection is severe, you may be prescribed an antibiotic medicine to help shorten the illness and to keep others from getting sick. Most people with this infection make a full recovery. However, in rare cases some people can develop lasting problems, such as arthritis, irritation of the eyes, painful urination, or a kidney problem called hemolytic-uremic syndrome. Follow these instructions at home: Drinking  Drink enough fluid to keep your urine clear or pale yellow.  If you are dehydrated, ask your health care provider for specific rehydration instructions. Signs of dehydration include: ? Thirst. ? Dry lips or mouth. ? Dark urine. ? Headache.  Drink only clear liquids until your diarrhea, nausea, or vomiting is under control. Clear liquids are liquids you can see  through, such as water, broth, or non-caffeinated tea. Avoid milk, fruit juice, alcohol, and very hot or very cold drinks. Eating  If you do not have an appetite, do not force yourself to eat.  If you do have an appetite, eat a well-balanced diet that includes: ? A variety of complex carbohydrates, such as rice, wheat, potatoes, and bread. ? Lean meats. ? Yogurt. ? Fruits. ? Vegetables.  Avoid high-fat foods. They are hard to digest.  Do not prepare food if you have diarrhea. Medicines  Take over-the-counter and prescription medicines only as told by your health care provider.  Do not take medicines to help with diarrhea. These medicines can make the infection worse.  If you were prescribed an antibiotic medicine, take it as told by your health care provider. Do not stop taking the antibiotic even if you start to feel better. Other Instructions   Wash your hands well. This helps keep the bacteria from spreading to others. Use soap and water, or use hand sanitizer if soap and water are not available.  Keep track of changes in your weight. Losing a lot of weight can be a sign of a serious problem. Ask your health care provider how much weight loss should concern you.  Keep all follow-up visits as told by your health care provider. This is important. Get help right away if:  You cannot keep fluids down.  You keep vomiting or having diarrhea.  You have abdominal pain that gets worse.  You have abdominal pain in one small area.  Your diarrhea has more blood or mucus than before.  You have a fever.  You have any symptoms of severe dehydration. These include: ? Extreme thirst. ? Confusion. ? Inability to sweat. ? Fainting. ? Dizziness. ? Weight loss. This information is not intended to replace advice given to you by your health care provider. Make sure you discuss any questions you have with your health care provider. Document Released: 03/07/2000 Document Revised:  11/03/2015 Document Reviewed: 12/02/2014 Elsevier Interactive Patient Education  2018 [employer] Food Choices to Help Relieve Diarrhea, Adult When you have diarrhea, the foods you eat and your eating habits are very important. Choosing the right foods and drinks can help:  Relieve diarrhea.  Replace lost fluids and nutrients.  Prevent dehydration.  What general guidelines should I follow? Relieving diarrhea  Choose foods with less than 2 g or .07 oz. of fiber per serving.  Limit fats to less than 8 tsp (38 g or 1.34 oz.) a day.  Avoid the following: ? Foods and beverages sweetened with high-fructose corn syrup, honey, or sugar alcohols such as xylitol, sorbitol, and mannitol. ? Foods that contain a lot of fat or sugar. ? Fried, greasy, or spicy foods. ? High-fiber grains, breads, and cereals. ? Raw fruits and vegetables.  Eat foods that are rich in probiotics. These foods include dairy products such as yogurt and fermented milk products. They help increase healthy bacteria in the stomach and intestines (gastrointestinal tract, or GI tract).  If you have lactose intolerance, avoid dairy products. These may make your diarrhea worse.  Take medicine to help stop diarrhea (antidiarrheal medicine) only as told by your health care provider. Replacing nutrients  Eat small meals or snacks every 3-4 hours.  Eat bland foods, such as white rice, toast, or baked potato, until your diarrhea starts to get better. Gradually reintroduce nutrient-rich foods as tolerated or as told by your health care provider. This includes: ? Well-cooked protein foods. ? Peeled, seeded, and soft-cooked fruits and vegetables. ? Low-fat dairy products.  Take vitamin and mineral supplements as told by your health care provider. Preventing dehydration   Start by sipping water or a special solution to prevent dehydration (oral rehydration solution, ORS). Urine that is clear or pale yellow means that you are  getting enough fluid.  Try to drink at least 8-10 cups of fluid each day to help replace lost fluids.  You may add other liquids in addition to water, such as clear juice or decaffeinated sports drinks, as tolerated or as told by your health care provider.  Avoid drinks with caffeine, such as coffee, tea, or soft drinks.  Avoid alcohol. What foods are recommended? The items listed may not be a complete list. Talk with your health care provider about what dietary choices are best for you. Grains White rice. White, [place], or pita breads (fresh or toasted), including plain rolls, buns, or bagels. White pasta. Saltine, soda, or Genevra Orne crackers. Pretzels. Low-fiber cereal. Cooked cereals made with water (such as cornmeal, farina, or cream cereals). Plain muffins. Matzo. Melba toast. Zwieback. Vegetables Potatoes (without the skin). Most well-cooked and canned vegetables without skins or seeds. Tender lettuce. Fruits Apple sauce. Fruits canned in juice. Cooked apricots, cherries, grapefruit, peaches, pears, or plums. Fresh bananas and cantaloupe. Meats and other protein foods Baked or boiled chicken. Eggs. Tofu. Fish. Seafood. Smooth nut butters. Ground or well-cooked tender beef, ham, veal, lamb, pork, or poultry. Dairy Plain yogurt, kefir, and unsweetened liquid yogurt. Lactose-free milk, buttermilk, skim milk, or soy milk. Low-fat or nonfat hard cheese. Beverages Water. Low-calorie sports drinks. Fruit  juices without pulp. Strained tomato and vegetable juices. Decaffeinated teas. Sugar-free beverages not sweetened with sugar alcohols. Oral rehydration solutions, if approved by your health care provider. Seasoning and other foods Bouillon, broth, or soups made from recommended foods. What foods are not recommended? The items listed may not be a complete list. Talk with your health care provider about what dietary choices are best for you. Grains Whole grain, whole wheat, bran, or rye breads,  rolls, pastas, and crackers. Wild or brown rice. Whole grain or bran cereals. Barley. Oats and oatmeal. Corn tortillas or taco shells. Granola. Popcorn. Vegetables Raw vegetables. Fried vegetables. Cabbage, broccoli, Brussels sprouts, artichokes, baked beans, beet greens, corn, kale, legumes, peas, sweet potatoes, and yams. Potato skins. Cooked spinach and cabbage. Fruits Dried fruit, including raisins and dates. Raw fruits. Stewed or dried prunes. Canned fruits with syrup. Meat and other protein foods Fried or fatty meats. Deli meats. Chunky nut butters. Nuts and seeds. Beans and lentils. Tomasa Blase. Hot dogs. Sausage. Dairy High-fat cheeses. Whole milk, chocolate milk, and beverages made with milk, such as milk shakes. Half-and-half. Cream. sour cream. Ice cream. Beverages Caffeinated beverages (such as coffee, tea, soda, or energy drinks). Alcoholic beverages. Fruit juices with pulp. Prune juice. Soft drinks sweetened with high-fructose corn syrup or sugar alcohols. High-calorie sports drinks. Fats and oils Butter. Cream sauces. Margarine. Salad oils. Plain salad dressings. Olives. Avocados. Mayonnaise. Sweets and desserts Sweet rolls, doughnuts, and sweet breads. Sugar-free desserts sweetened with sugar alcohols such as xylitol and sorbitol. Seasoning and other foods Honey. Hot sauce. Chili powder. Gravy. Cream-based or milk-based soups. Pancakes and waffles. Summary  When you have diarrhea, the foods you eat and your eating habits are very important.  Make sure you get at least 8-10 cups of fluid each day, or enough to keep your urine clear or pale yellow.  Eat bland foods and gradually reintroduce healthy, nutrient-rich foods as tolerated, or as told by your health care provider.  Avoid high-fiber, fried, greasy, or spicy foods. This information is not intended to replace advice given to you by your health care provider. Make sure you discuss any questions you have with your health care  provider. Document Released: 05/31/2003 Document Revised: 03/07/2016 Document Reviewed: 03/07/2016 Elsevier Interactive Patient Education  [employer].

## 2017-06-15 NOTE — Progress Notes (Signed)
Patient has had diarrhea/vomiting  And abd cramping x4d tried avonacado for the first time

## 2017-06-15 NOTE — Progress Notes (Signed)
Gina KoyanagiJennifer D Hester is a 42 y.o. female who presents with 6 days of diarrhea after eating an avocado. She reports symptoms within hours of eating this produce. 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 Patient today is concerned about abdominal tenderness, diarrhea that has improved since onset but still persist today  about 5 times a day and patient concern for risk of Listeria or some other infection that is causing her symptoms. Report bright red stool.  Review of Systems  Constitutional: Negative.   HENT: Negative.   Eyes: Negative.   Respiratory: Negative.   Cardiovascular: Negative.   Gastrointestinal: Positive for abdominal pain, diarrhea and vomiting.  Genitourinary: Negative.   Musculoskeletal: Negative.   Skin: Negative.   Neurological: Negative.   Endo/Heme/Allergies: Negative.   Psychiatric/Behavioral: Negative.    O: Vitals:   06/15/17 1217  BP: 120/82  Pulse: 91  Temp: 98.3 F (36.8 C)  SpO2: 98%   Physical Exam  Constitutional: She is oriented to person, place, and time. She appears well-developed and well-nourished. No distress.  HENT:  Head: Normocephalic.  Eyes: Pupils are equal, round, and reactive to light.  Neck: Normal range of motion.  Cardiovascular: Normal rate and regular rhythm.  Pulmonary/Chest: Effort normal and breath sounds normal.  Abdominal: Soft. Bowel sounds are normal. She exhibits no distension and no mass. There is tenderness. There is no rebound and no guarding. No hernia.  Neurological: She is alert and oriented to person, place, and time.  Skin: Skin is warm and dry. She is not diaphoretic.    A: 1. Gastroenteritis   2. Diarrhea, unspecified type    P: 24 hour work note 48 hour in person f/u advised- patient is scheduled with employee health- on 06/17/2017 Medication use and indications and side effects discussed Patient agrees with treatment plan of care at this time. Consulted  Supervising MD Dr. Stacie GlazeJohn E. Jenkins- consensus that symptoms most likely not due to Listeria due to known incubation period of 11-28 days, presence of inflammatory diarrhea symptoms and severity of symptoms so swiftly. Bloody diarrhea risks include anal excoriation and potential for hemorrhoids. Will treat with Uptodate recommended treatment for Shigella that also provide coverage common bugs as well as patient concern of Listeria.  1. Gastroenteritis - levofloxacin (LEVAQUIN) 500 MG tablet; Take 1 tablet (500 mg total) by mouth daily for 3 days. - dicyclomine (BENTYL) 10 MG capsule; Take 1 capsule (10 mg total) by mouth 2 times daily at 12 noon and 4 pm.  2. Diarrhea, unspecified type Bland diet, increased hydration, excellent hand hygiene and prn application to vaseline to anal area prn pain and excoriation.

## 2017-06-17 ENCOUNTER — Ambulatory Visit: Payer: Self-pay | Admitting: Family Medicine

## 2017-06-17 VITALS — BP 125/82 | HR 79 | Temp 98.7°F | Resp 16

## 2017-06-17 DIAGNOSIS — K529 Noninfective gastroenteritis and colitis, unspecified: Secondary | ICD-10-CM

## 2017-06-17 DIAGNOSIS — R102 Pelvic and perineal pain: Secondary | ICD-10-CM

## 2017-06-17 LAB — POCT URINALYSIS DIPSTICK
Bilirubin, UA: NEGATIVE
Glucose, UA: NEGATIVE
Ketones, UA: NEGATIVE
Leukocytes, UA: NEGATIVE
NITRITE UA: NEGATIVE
PH UA: 6.5 (ref 5.0–8.0)
PROTEIN UA: NEGATIVE
Spec Grav, UA: 1.02 (ref 1.010–1.025)
UROBILINOGEN UA: 0.2 U/dL

## 2017-06-17 NOTE — Progress Notes (Signed)
06/15/17: Gina KoyanagiJennifer D Hester is a 42 y.o. female who presents with 6 days of diarrhea after eating an avocado. She reports symptoms within hours of eating this produce. 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 Patient today is concerned about abdominal tenderness, diarrhea that has improved since onset but still persist today  about 5 times a day and patient concern for risk of Listeria or some other infection that is causing her symptoms. Report bright red stool.  06/17/17: Patient reports feeling significantly better today.  Stools have begun to gradually become more formed and patient is no longer vomiting.  She originally had bright red blood in her stool, copious amounts, she reports this is improved significantly and only a very small amount of bright red is occasionally present, which she believes is related to external anal irritation.  Patient has been treating this with the application of Vaseline as recommended by the provider she saw 2 days ago.  She reports this has been soothing and reports less irritation.  Reports nausea shortly after taking antibiotics but otherwise none.  Denies fevers, chills, malaise, fatigue, or any other symptoms.  Patient is on her last day of Levaquin and has been taking Bentyl as prescribed.  Significant improvement in generalized abdominal pain, only reports mild midline, lower abdominal pain, especially with urination.  Denies flank pain, hematuria or any other urinary symptoms.  Denies any vaginal symptoms.  Patient has been tolerating drinking and eating well.  Overall patient reports significant improvement in her symptoms.  06/15/17: Review of Systems  Constitutional: Negative.   HENT: Negative.   Eyes: Negative.   Respiratory: Negative.   Cardiovascular: Negative.   Gastrointestinal: Positive for abdominal pain, diarrhea and vomiting.  Genitourinary: Negative.   Musculoskeletal: Negative.   Skin: Negative.    Neurological: Negative.   Endo/Heme/Allergies: Negative.   Psychiatric/Behavioral: Negative.    06/17/17: ROS otherwise negative other than what is listed above in today's HPI.  Objective: 06/15/17: Physical Exam  Constitutional: She is oriented to person, place, and time. She appears well-developed and well-nourished. No distress.  HENT:  Head: Normocephalic.  Eyes: Pupils are equal, round, and reactive to light.  Neck: Normal range of motion.  Cardiovascular: Normal rate and regular rhythm.  Pulmonary/Chest: Effort normal and breath sounds normal.  Abdominal: Soft. Bowel sounds are normal. She exhibits no distension and no mass. There is tenderness. There is no rebound and no guarding. No hernia.  Neurological: She is alert and oriented to person, place, and time.  Skin: Skin is warm and dry. She is not diaphoretic.   06/17/17: Physical exam   General appearance: alert, cooperative, appears stated age and no distress Back: symmetric, no curvature. ROM normal. No CVA tenderness. Abdomen: soft, bowel sounds normal; no masses,  no organomegaly.  Positive for suprapubic tenderness.  Otherwise nontender.  No rebound tenderness or guarding. Skin: Skin color, texture, turgor normal. No rashes or lesions   Diagnostic Results: Urine dip stick negative except for trace blood present.  Discussed with patient.  Assessment:    Abdominal pain, likely secondary to resolving gastroenteritis.    Plan:    The diagnosis was discussed with the patient and evaluation and treatment plans outlined.   Urine culture sent due to suprapubic tenderness and pain with urination, but unlikely that she has urinary tract infection due to being on Levaquin. Follow-up with primary care provider. Red flag symptoms and indications to seek medical care discussed.

## 2017-06-19 LAB — URINE CULTURE: ORGANISM ID, BACTERIA: NO GROWTH

## 2017-06-19 NOTE — Progress Notes (Signed)
Ms. Gina Hester, I wanted to let you know that your urine culture came back negative for a urinary tract infection, great news!

## 2017-09-09 ENCOUNTER — Other Ambulatory Visit: Payer: Self-pay | Admitting: Obstetrics and Gynecology

## 2017-09-09 DIAGNOSIS — Z1231 Encounter for screening mammogram for malignant neoplasm of breast: Secondary | ICD-10-CM

## 2017-09-21 ENCOUNTER — Ambulatory Visit: Payer: Self-pay

## 2017-10-06 ENCOUNTER — Ambulatory Visit: Payer: Self-pay

## 2017-10-14 ENCOUNTER — Ambulatory Visit
Admission: RE | Admit: 2017-10-14 | Discharge: 2017-10-14 | Disposition: A | Discharge status: Home / Self Care | Payer: Managed Care, Other (non HMO) | Source: Ambulatory Visit | Attending: Obstetrics and Gynecology | Admitting: Obstetrics and Gynecology

## 2017-10-14 DIAGNOSIS — Z1231 Encounter for screening mammogram for malignant neoplasm of breast: Secondary | ICD-10-CM | POA: Diagnosis not present

## 2017-10-15 ENCOUNTER — Ambulatory Visit: Payer: Self-pay | Admitting: Physician Assistant

## 2017-10-15 VITALS — BP 130/73 | HR 80 | Temp 98.3°F | Resp 17 | Wt 194.0 lb

## 2017-10-15 DIAGNOSIS — T39395A Adverse effect of other nonsteroidal anti-inflammatory drugs [NSAID], initial encounter: Principal | ICD-10-CM

## 2017-10-15 DIAGNOSIS — K269 Duodenal ulcer, unspecified as acute or chronic, without hemorrhage or perforation: Secondary | ICD-10-CM

## 2017-10-15 NOTE — Progress Notes (Signed)
S: 42 year old female presents to [facility] for evaluation of 1-1/2 weeks of abdominal discomfort. Located in epigastric area. Describes as burning sensation. Associated increased eructation. Pain most severe 30-45 minutes after eating and at night. Patient has been eating smaller meals and bland diet; but is not helping. Has not taken any OTC medication for symptom relief. Patient with previous history of GERD; occasional flare-ups, a few times a year, resolves with one dose of OTC Pepcid (famotidine). Patient denies any recent vacation with worsening diet, recent alcohol binge, tobacco use, increase in spicy foods. Patient does admit to eating greasy foods. Patient with history of lumbar fusion. Has chronic back pain. Patient has been on Felden (piroxicam), NSAID, for years. Typically no stomach upset. Patient reports associated nausea, three episodes of vomiting, lack of energy, and change in stools. Patient states stools are soft; denies diarrhea. Also has noted a change in color, to a sand/tan. Denies fever, hematochezia, diarrhea, melena or bright red blood per rectum, urinary changes. No history of gallbladder issues/disease personally or in family history. Father and mother with DM2. Patient over past several years, secondary to back issues, has gained weight. Last fasting blood glucose 94, one year ago.   O: Patient sitting comfortably on examination table. In no acute distress. Afebrile. Heart regular rate and rhythm. Lungs clear to ausculation. Abdomen with normoactive bowel sounds. Soft. Nondistended. No palpable mass. Moderate tenderness in epigastric area. Mild tenderness in LUQ. Negative Murphy's sign.   A: Duodenal ulcer. 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 Differential gastroenteritis, duodenal ulcer due to H. Pylori., cholecystitis, Type2 DM, etc.  P: Will treat with 4 week course of PPI (Protonix). Prescribed Zofran for nausea.  Patient will return in one week for re-evaluation. Will  return fasting in case of bloodwork and/or RUQ ultrasound.  If patient with minimal improvement, will order CBC, CMP, amylase/lipase, A1C and H. Pylori testing. Will order stat RUQ ultrasound for evaluation of gallbladder. Next step may involve referral to gastroenterologist for upper endoscopy and/or HIDA scan.  Patient understands and agrees with plan.

## 2017-10-22 ENCOUNTER — Ambulatory Visit
Admission: RE | Admit: 2017-10-22 | Discharge: 2017-10-22 | Disposition: A | Discharge status: Home / Self Care | Payer: Managed Care, Other (non HMO) | Source: Ambulatory Visit | Attending: Physician Assistant | Admitting: Physician Assistant

## 2017-10-22 ENCOUNTER — Telehealth: Payer: Self-pay | Admitting: Physician Assistant

## 2017-10-22 ENCOUNTER — Ambulatory Visit: Payer: Self-pay | Admitting: Physician Assistant

## 2017-10-22 VITALS — BP 111/73 | HR 69 | Resp 15 | Wt 189.0 lb

## 2017-10-22 DIAGNOSIS — R1013 Epigastric pain: Secondary | ICD-10-CM | POA: Diagnosis present

## 2017-10-22 MED ORDER — SUCRALFATE 1 GM/10ML PO SUSP: 1.0000 g | Freq: Three times a day (TID) | ORAL | 0 refills | Status: DC

## 2017-10-22 NOTE — Patient Instructions (Signed)
A STAT abdominal ultrasound has been ordered for further evaluation. You may go to the outpatient imaging center.  Blood work has been drawn today. We will call you with the results.  Continue to take previously prescribed Protonix. May continue to use Zofran (ondansetron) for nausea.  You have been prescribed new medication, Sucralfate, for symptom relief.  We will also arrange for you to follow-up with gastroenterology.

## 2017-10-22 NOTE — Progress Notes (Signed)
S: 42 year old female [facility] as scheduled for one week f/u in regards to epigastric abdominal pain. Symptoms now present for 2-/12 weeks. Associated vomiting, eructation, abdominal bloating/distension feeling, early satiety, and epigastric/sternal burning sensation.  Patient treated with Protonix and Zofran (for nausea), suspected duodenal ulcer.  Patient returns with no improvement. States eructation has worsened. Patient has new onset constipation. Has not had a bowel movement since this past Saturday, five days ago. Patient suspects eating less has contributed to constipation.  Patient reports 5lb weight loss over past week.   Patient states when she eats almost anything, causes nausea. Typically also vomiting. Able to drink fluids without too much discomfort.  O: VSS. Afebrile. Patient sitting comfortably on examination table. In no acute distress. Heart regular rate and rhythm. Lungs clear to auscultation. Abdomen normal to inspection. Patient reports bloating/distension sensation. Bowel sounds in all four quadrants. Abdomen soft to palpation. Mild tenderness with palpation in epigastric area. No guarding, rigidity, or rebound.  A: Still suspect patient has gastritis/duodenal ulcer.  As discussed, due to lack of improvement, will order a STAT abdominal ultrasound for further evaluation.  Addendum: Abdominal ultrasound revealed no abnormality.  Will order blood work; CBC, CMP, amylase, lipase, and H. Pylori IgG. Will call patient with results and if any change in treatment plan.  Added Carafate to patient's medication to help with symptom relief. Advised patient continue Protonix as prescribed.  Advised patient follow-up with gastroenterology for further evaluation and treatment.

## 2017-10-23 LAB — CBC WITH DIFFERENTIAL/PLATELET
BASOS: 1 %
Basophils Absolute: 0.1 10*3/uL (ref 0.0–0.2)
EOS (ABSOLUTE): 0.2 10*3/uL (ref 0.0–0.4)
Eos: 3 %
Hematocrit: 43 % (ref 34.0–46.6)
Hemoglobin: 14 g/dL (ref 11.1–15.9)
IMMATURE GRANS (ABS): 0 10*3/uL (ref 0.0–0.1)
Immature Granulocytes: 0 %
LYMPHS ABS: 2.2 10*3/uL (ref 0.7–3.1)
LYMPHS: 31 %
MCH: 28.5 pg (ref 26.6–33.0)
MCHC: 32.6 g/dL (ref 31.5–35.7)
MCV: 88 fL (ref 79–97)
Monocytes Absolute: 0.5 10*3/uL (ref 0.1–0.9)
Monocytes: 6 %
NEUTROS ABS: 4.2 10*3/uL (ref 1.4–7.0)
Neutrophils: 59 %
PLATELETS: 272 10*3/uL (ref 150–450)
RBC: 4.91 x10E6/uL (ref 3.77–5.28)
RDW: 13.7 % (ref 12.3–15.4)
WBC: 7.1 10*3/uL (ref 3.4–10.8)

## 2017-10-23 LAB — COMPREHENSIVE METABOLIC PANEL
A/G RATIO: 1.6 (ref 1.2–2.2)
ALBUMIN: 4.4 g/dL (ref 3.5–5.5)
ALT: 15 IU/L (ref 0–32)
AST: 17 IU/L (ref 0–40)
Alkaline Phosphatase: 60 IU/L (ref 39–117)
BILIRUBIN TOTAL: 0.5 mg/dL (ref 0.0–1.2)
BUN / CREAT RATIO: 13 (ref 9–23)
BUN: 12 mg/dL (ref 6–24)
CHLORIDE: 107 mmol/L — AB (ref 96–106)
CO2: 25 mmol/L (ref 20–29)
Calcium: 9.5 mg/dL (ref 8.7–10.2)
Creatinine, Ser: 0.95 mg/dL (ref 0.57–1.00)
GFR calc non Af Amer: 75 mL/min/{1.73_m2} (ref >59)
GFR, EST AFRICAN AMERICAN: 86 mL/min/{1.73_m2} (ref >59)
Globulin, Total: 2.7 g/dL (ref 1.5–4.5)
Glucose: 81 mg/dL (ref 65–99)
POTASSIUM: 5 mmol/L (ref 3.5–5.2)
Sodium: 142 mmol/L (ref 134–144)
TOTAL PROTEIN: 7.1 g/dL (ref 6.0–8.5)

## 2017-10-23 LAB — H. PYLORI ANTIBODY, IGG: H. pylori, IgG AbS: <0.8 Index Value (ref 0.00–0.79)

## 2017-10-23 LAB — LIPASE: Lipase: 20 U/L (ref 14–72)

## 2017-10-23 LAB — AMYLASE: Amylase: 52 U/L (ref 31–124)

## 2017-12-07 NOTE — Telephone Encounter (Signed)
Telephone call  

## 2018-02-20 IMAGING — CT CT HEAD W/O CM
4 series · 17 of 47 positions shown, 19 images · non-contrast
Comparison: None.

CLINICAL DATA: Vomiting after hitting her head on a cabinet door
last night with head pain at the location of injury.

EXAM:
CT HEAD WITHOUT CONTRAST
TECHNIQUE: Contiguous axial images were obtained from the base of the skull
through the vertex without intravenous contrast.

[Series 2: head wo · axial · 0.41mm/px · z∈[+1238,+1343]mm · 7 of 29 slices shown, 9 images]
[im 4/29  brain]
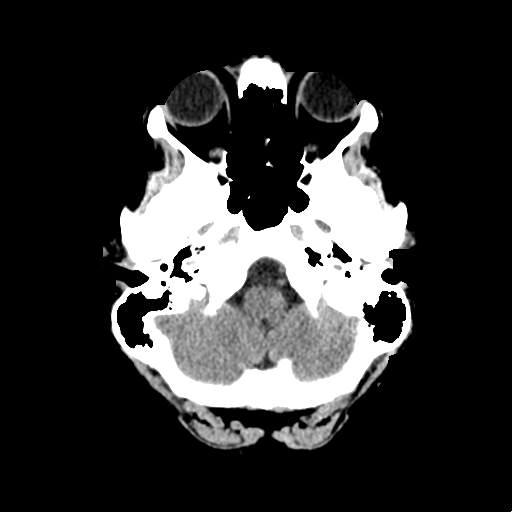
[im 4/29  bone]
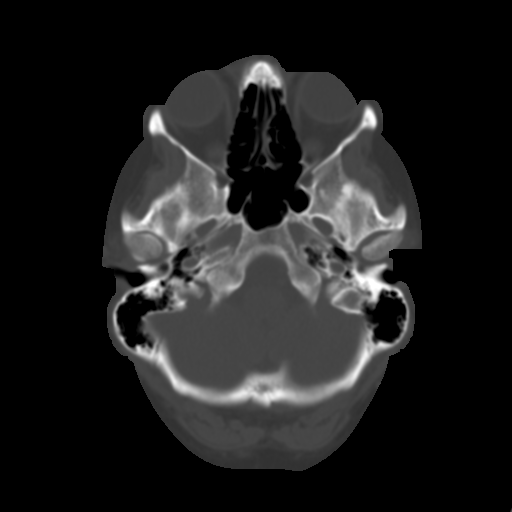
[im 8/29  brain]
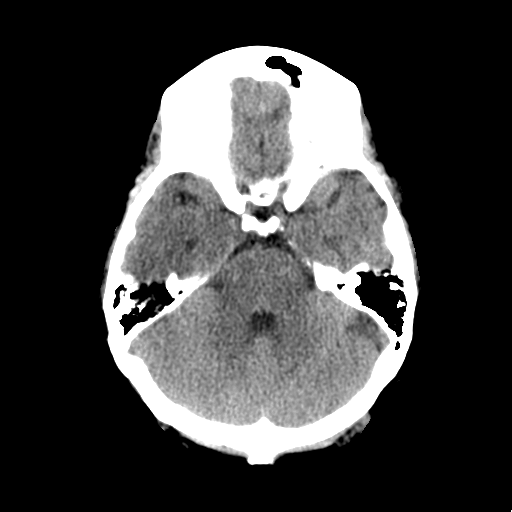
[im 11/29  brain]
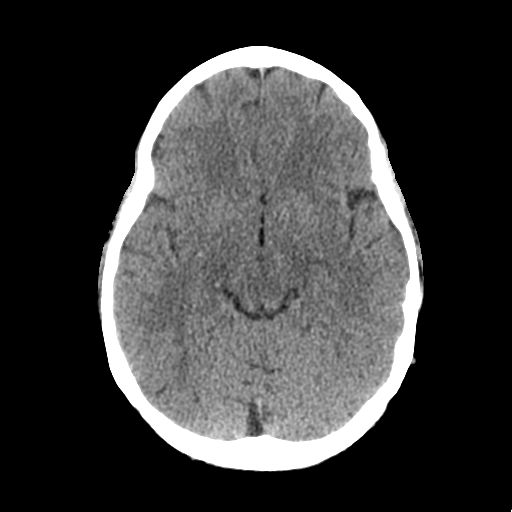
[im 15/29  brain]
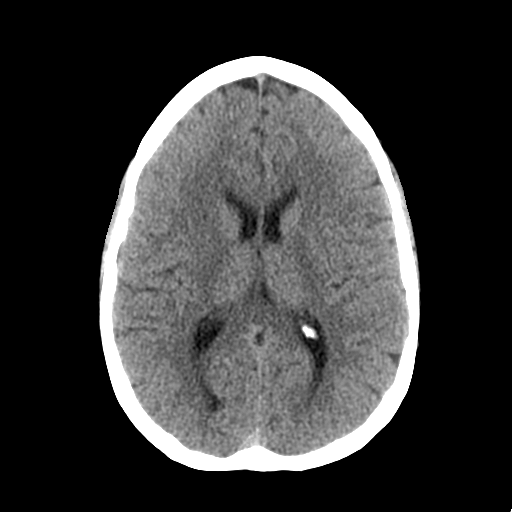
[im 18/29  brain]
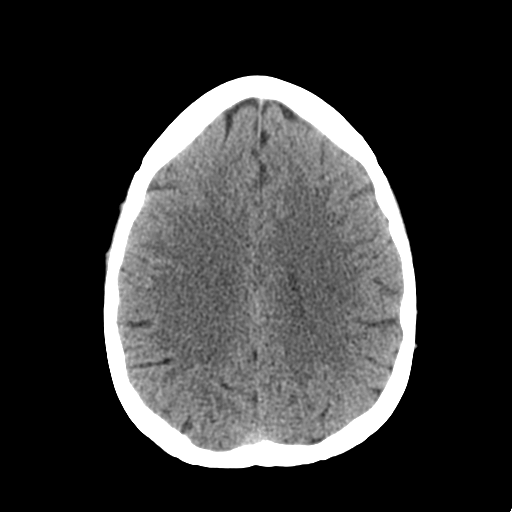
[im 18/29  bone]
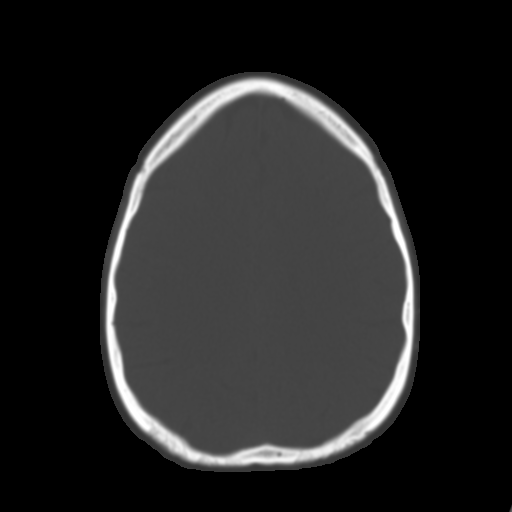
[im 22/29  brain]
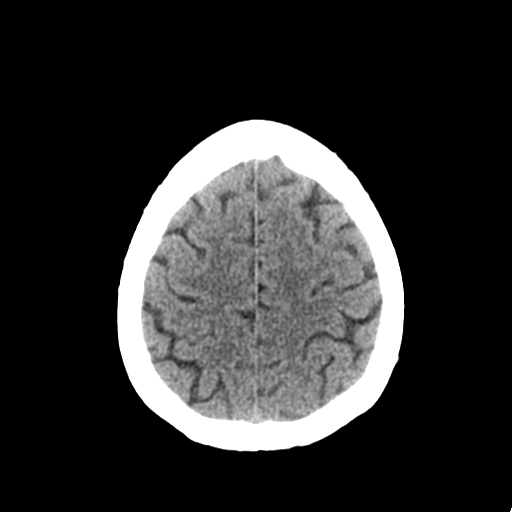
[im 25/29  brain]
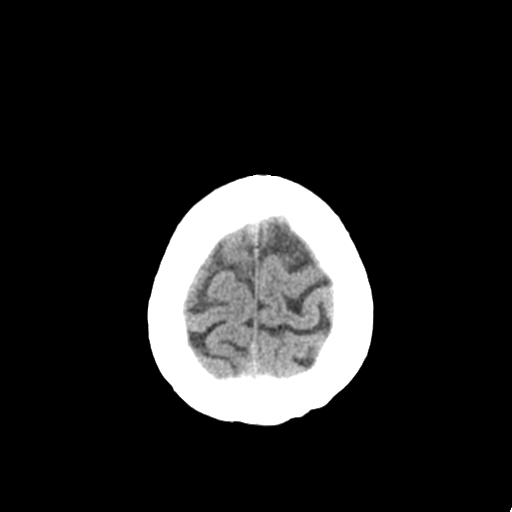

[Series 3: head bone · axial · 0.41mm/px · z∈[+1237,+1285]mm · 4 of 72 slices shown]
[im 8/72  bone]
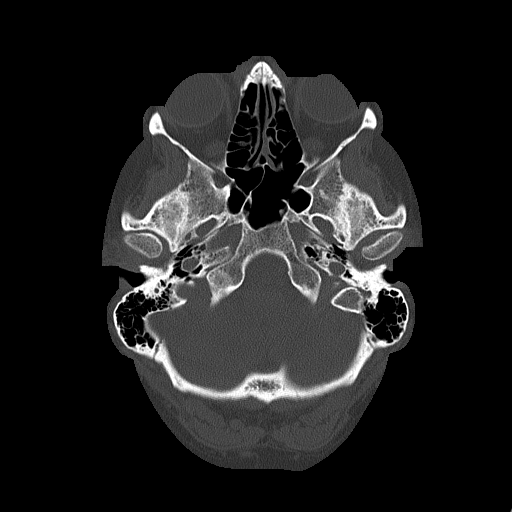
[im 15/72  bone]
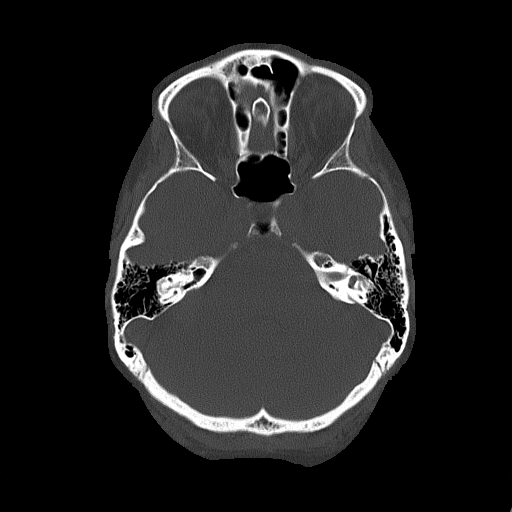
[im 22/72  bone]
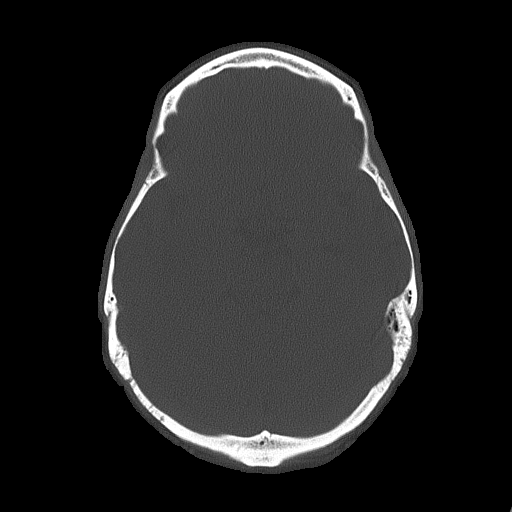
[im 32/72  bone]
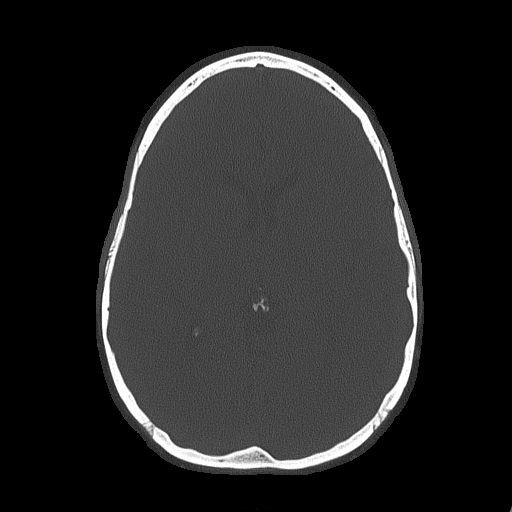

[Series 4: coronal soft tissue · coronal · 0.29mm/px · 3 of 58 slices shown]
[im 20/58  brain]
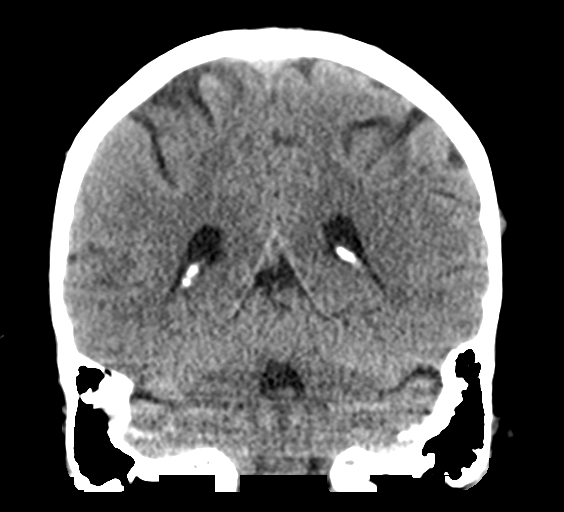
[im 26/58  brain]
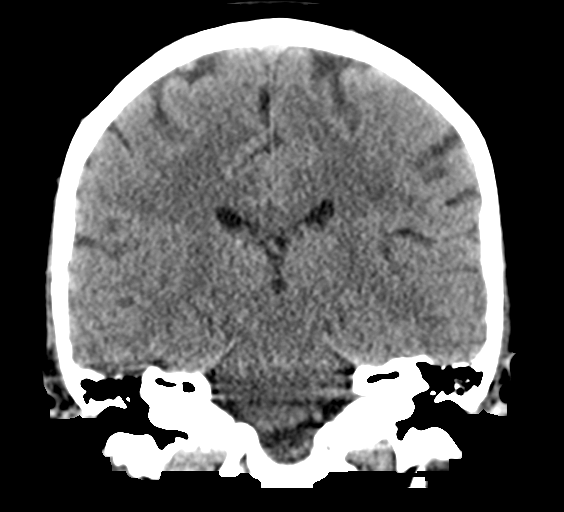
[im 32/58  brain]
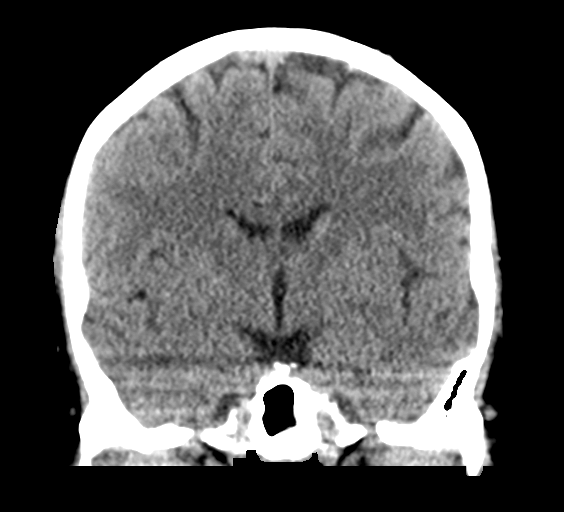

[Series 5: sagittal soft tissue · sagittal · 0.29mm/px · 3 of 47 slices shown]
[im 16/47  brain]
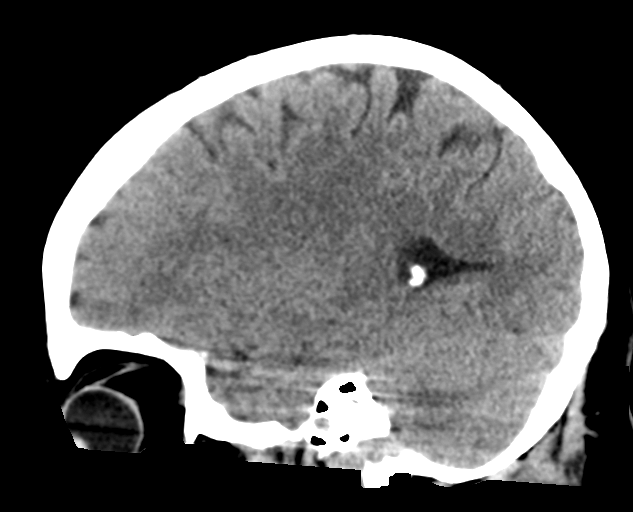
[im 24/47  brain]
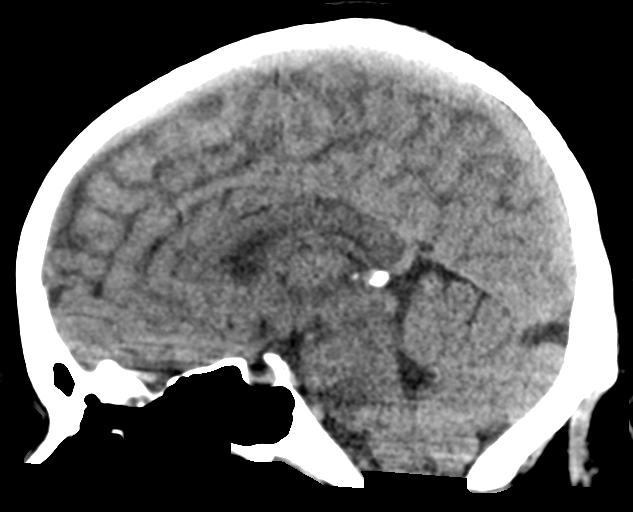
[im 31/47  brain]
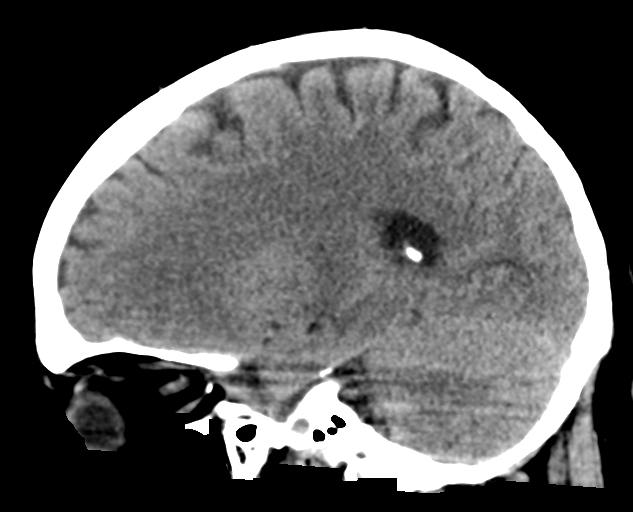

[17 of 47 positions shown; findings below may reference images not displayed]

FINDINGS: Brain: Normal appearing cerebral hemispheres and posterior fossa
structures. Normal size and position of the ventricles. No
intracranial hemorrhage, mass lesion or CT evidence of acute
infarction.

Vascular: No hyperdense vessel or unexpected calcification.

Skull: Normal. Negative for fracture or focal lesion.

Sinuses/Orbits: Unremarkable.

Other: None.
IMPRESSION: Normal examination.

## 2018-11-17 ENCOUNTER — Encounter: Payer: Self-pay | Admitting: Adult Health

## 2018-11-17 ENCOUNTER — Ambulatory Visit: Payer: Managed Care, Other (non HMO) | Admitting: Adult Health

## 2018-11-17 ENCOUNTER — Other Ambulatory Visit: Payer: Self-pay

## 2018-11-17 VITALS — BP 122/84 | HR 81 | Temp 98.1°F | Resp 16 | Ht 67.0 in | Wt 187.0 lb

## 2018-11-17 DIAGNOSIS — Z0189 Encounter for other specified special examinations: Secondary | ICD-10-CM | POA: Diagnosis not present

## 2018-11-17 DIAGNOSIS — Z008 Encounter for other general examination: Secondary | ICD-10-CM | POA: Diagnosis not present

## 2018-11-17 NOTE — Patient Instructions (Addendum)
The Biometric exam is a brief physical with labs including glucose and cholesterol. This does not replace a full physical with a primary care provider, and additional recommended labs. This is an acute care clinic not for maintenance of chronic or long standing conditions.   Provider also recommends if you do not have a primary care provider for patient to establish care promptly.You can choose any provider of your choice at any facility of your choice, the below information is  just a resource to aid in you finding a primary care provider for routine health maintenance.   [place]  PHYSICIAN/PROVIDER  REFERRAL LINE at [phone]  [email or web address] to help assist with finding a primary care doctor.   Helpful resources below of other primary care office's accepting new patients.   Lovie Macadamia. [facility]         [address]  [place] [address] [phone]  . [facility]    [address], [address] [place], [place] [address] [phone]  . [facility] [address]  [place], [place]  [phone]   . [facility]  [address], [address] [place] [address] [phone]    Follow up with primary care as needed for chronic and maintenance health care- can be seen in this employee clinic for acute care.     I will have the office call you on your glucose and cholesterol results when they return if you have not heard within 1 week please call the office.  This biometric physical is a brief physical and the only labs done are glucose and your lipid panel(cholesterol) and is  not a substitute for seeing a primary care provider for a complete annual physical. Please see a primary care physician for routine health maintenance, labs and full physical at least yearly and follow up as recommended by your provider. Provider also recommends if you do not have a primary care  provider for patient to establish care as soon as possible .Patient may chose provider of choice. Also gave the [place]  PHYSICIAN/PROVIDER  REFERRAL LINE at [phone] or web site at [email or web address] to help assist with finding a primary care doctor.  Patient verbalizes understanding that his office is acute care only and not a substitute for a primary care or for the management of chronic conditions.    Health Maintenance, Female Adopting a healthy lifestyle and getting preventive care are important in promoting health and wellness. Ask your health care provider about:  The right schedule for you to have regular tests and exams.  Things you can do on your own to prevent diseases and keep yourself healthy. What should I know about diet, weight, and exercise? Eat a healthy diet   Eat a diet that includes plenty of vegetables, fruits, low-fat dairy products, and lean protein.  Do not eat a lot of foods that are high in solid fats, added sugars, or sodium. Maintain a healthy weight Body mass index (BMI) is used to identify weight problems. It estimates body fat based on height and weight. Your health care provider can help determine your BMI and help you achieve or maintain a healthy weight. Get regular exercise Get regular exercise. This is one of the most important things you can do for your health. Most adults should:  Exercise for at least 150 minutes each week. The exercise should increase your heart rate and make you  sweat (moderate-intensity exercise).  Do strengthening exercises at least twice a week. This is in addition to the moderate-intensity exercise.  Spend less time sitting. Even light physical activity can be beneficial. Watch cholesterol and blood lipids Have your blood tested for lipids and cholesterol at 42 years of age, then have this test every 5 years. Have your cholesterol levels checked more often if:  Your lipid or cholesterol levels are high.  You are  older than 42 years of age.  You are at high risk for heart disease. What should I know about cancer screening? Depending on your health history and family history, you may need to have cancer screening at various ages. This may include screening for:  Breast cancer.  Cervical cancer.  Colorectal cancer.  Skin cancer.  Lung cancer. What should I know about heart disease, diabetes, and high blood pressure? Blood pressure and heart disease  High blood pressure causes heart disease and increases the risk of stroke. This is more likely to develop in people who have high blood pressure readings, are of African descent, or are overweight.  Have your blood pressure checked: ? Every 3-5 years if you are 41-24 years of age. ? Every year if you are 42 years old or older. Diabetes Have regular diabetes screenings. This checks your fasting blood sugar level. Have the screening done:  Once every three years after age 58 if you are at a normal weight and have a low risk for diabetes.  More often and at a younger age if you are overweight or have a high risk for diabetes. What should I know about preventing infection? Hepatitis B If you have a higher risk for hepatitis B, you should be screened for this virus. Talk with your health care provider to find out if you are at risk for hepatitis B infection. Hepatitis C Testing is recommended for:  Everyone born from [birth year] through 1964.  Anyone with known risk factors for hepatitis C. Sexually transmitted infections (STIs)  Get screened for STIs, including gonorrhea and chlamydia, if: ? You are sexually active and are younger than 42 years of age. ? You are older than 42 years of age and your health care provider tells you that you are at risk for this type of infection. ? Your sexual activity has changed since you were last screened, and you are at increased risk for chlamydia or gonorrhea. Ask your health care provider if you are at risk.   Ask your health care provider about whether you are at high risk for HIV. Your health care provider may recommend a prescription medicine to help prevent HIV infection. If you choose to take medicine to prevent HIV, you should first get tested for HIV. You should then be tested every 3 months for as long as you are taking the medicine. Pregnancy  If you are about to stop having your period (premenopausal) and you may become pregnant, seek counseling before you get pregnant.  Take 400 to 800 micrograms (mcg) of folic acid every day if you become pregnant.  Ask for birth control (contraception) if you want to prevent pregnancy. Osteoporosis and menopause Osteoporosis is a disease in which the bones lose minerals and strength with aging. This can result in bone fractures. If you are 2 years old or older, or if you are at risk for osteoporosis and fractures, ask your health care provider if you should:  Be screened for bone loss.  Take a calcium or vitamin D supplement to  lower your risk of fractures.  Be given hormone replacement therapy (HRT) to treat symptoms of menopause. Follow these instructions at home: Lifestyle  Do not use any products that contain nicotine or tobacco, such as cigarettes, e-cigarettes, and chewing tobacco. If you need help quitting, ask your health care provider.  Do not use street drugs.  Do not share needles.  Ask your health care provider for help if you need support or information about quitting drugs. Alcohol use  Do not drink alcohol if: ? Your health care provider tells you not to drink. ? You are pregnant, may be pregnant, or are planning to become pregnant.  If you drink alcohol: ? Limit how much you use to 0-1 drink a day. ? Limit intake if you are breastfeeding.  Be aware of how much alcohol is in your drink. In the [place] one drink equals one 12 oz bottle of beer (355 mL), one 5 oz glass of wine (148 mL), or one 1 oz glass of hard liquor (44 mL).  General instructions  Schedule regular health, dental, and eye exams.  Stay current with your vaccines.  Tell your health care provider if: ? You often feel depressed. ? You have ever been abused or do not feel safe at home. Summary  Adopting a healthy lifestyle and getting preventive care are important in promoting health and wellness.  Follow your health care provider's instructions about healthy diet, exercising, and getting tested or screened for diseases.  Follow your health care provider's instructions on monitoring your cholesterol and blood pressure. This information is not intended to replace advice given to you by your health care provider. Make sure you discuss any questions you have with your health care provider. Document Released: 09/23/2010 Document Revised: 03/03/2018 Document Reviewed: 03/03/2018 Elsevier Patient Education  2020 [employer].

## 2018-11-17 NOTE — Progress Notes (Signed)
[facility]  Gina Hester DOB: 43 y.o. MRN: [id number]  Subjective:  Here for Biometric Screen/brief exam Patient is a 43 year old female in no acute distress who comes to the clinic for biometric screening.  She works with the [employer] of the [employer] and enjoys her job, though she reports is been very busy during the [employer] pandemic.  She reports she is feeling well.  She has no health concerns at this time.  She denies any edema.  She does report a family history of hyperlipidemia and diabetes in her mother and father so she has got that she is getting her glucose and cholesterol done today.  She does not see a primary care provider but does note however that she needs to set up an appointment for a primary care provider physical. Patient  denies any fever, body aches,chills, rash, chest pain, shortness of breath, nausea, vomiting, or diarrhea.   He has started a medically supervised weight loss diet, she has gained weight since her back surgery years ago she reports never been able to lose the weight  Objective: Blood pressure 122/84, pulse 81, temperature 98.1 F (36.7 C), temperature source Temporal, resp. rate 16, height 5\' 7"  (1.702 m), weight 187 lb (84.8 kg), SpO2 97 %. NAD, well-developed well-nourished HEENT: Within normal limits Neck: Normal, no cervical lymphadenopathy, supple Heart: Regular rate and rhythm Lungs: Clear to auscultation without any adventitious lung sounds  Assessment: Biometric screen Encounter for other general examination-brief biometric screening and brief exam-this is not an annual full physical - Plan: Glucose, random, Lipid Panel With LDL/HDL Ratio  Encounter for biometric screening - Plan: Glucose, random, Lipid Panel With LDL/HDL Ratio   Plan:  I will have the office call you on your glucose and cholesterol results when they return if you have not heard within 1 week please  call the office.  This biometric physical is a brief physical and the only labs done are glucose and your lipid panel(cholesterol) and is  not a substitute for seeing a primary care provider for a complete annual physical. Please see a primary care physician for routine health maintenance, labs and full physical at least yearly and follow up as recommended by your provider. Provider also recommends if you do not have a primary care provider for patient to establish care as soon as possible .Patient may chose provider of choice. Also gave the [facility] at [phone] or web site at [email or web address] to help assist with finding a primary care doctor.  Patient verbalizes understanding that his office is acute care only and not a substitute for a primary care or for the management of chronic conditions.    Fasting glucose and lipids. Discussed with patient that today's visit here is a limited biometric screening visit (not a comprehensive exam or management of any chronic problems) Discussed some health issues, including healthy eating habits and exercise. Encouraged to follow-up with PCP for annual comprehensive preventive and wellness care (and if applicable, any chronic issues). Questions invited and answered.

## 2018-11-18 ENCOUNTER — Encounter: Payer: Self-pay | Admitting: Adult Health

## 2018-11-18 LAB — LIPID PANEL WITH LDL/HDL RATIO
Cholesterol, Total: 187 mg/dL (ref 100–199)
HDL: 45 mg/dL (ref >39)
LDL Calculated: 119 mg/dL — ABNORMAL HIGH (ref 0–99)
LDl/HDL Ratio: 2.6 ratio (ref 0.0–3.2)
Triglycerides: 115 mg/dL (ref 0–149)
VLDL Cholesterol Cal: 23 mg/dL (ref 5–40)

## 2018-11-18 LAB — GLUCOSE, RANDOM: Glucose: 94 mg/dL (ref 65–99)

## 2018-11-30 IMAGING — US US ABDOMEN COMPLETE
1 series · 14 of 25 positions shown · non-contrast
Comparison: CT 01/20/2015.

CLINICAL DATA: Abdominal pain.

EXAM:
ABDOMEN ULTRASOUND COMPLETE

[Series 1: us abdomen complete · 0.20mm/px · 14 of 108 slices shown]
[im 1/108]
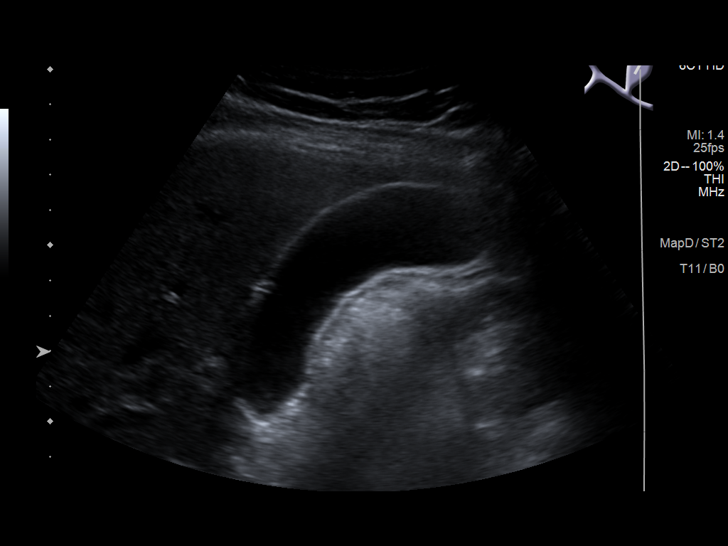
[im 9/108]
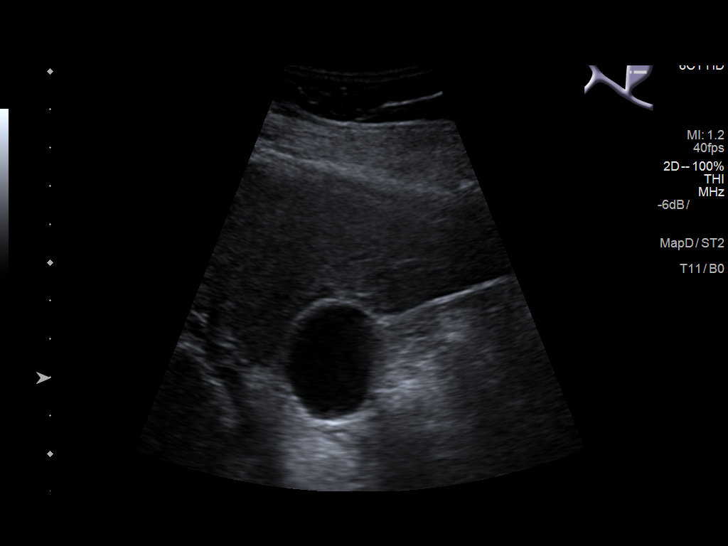
[im 18/108]
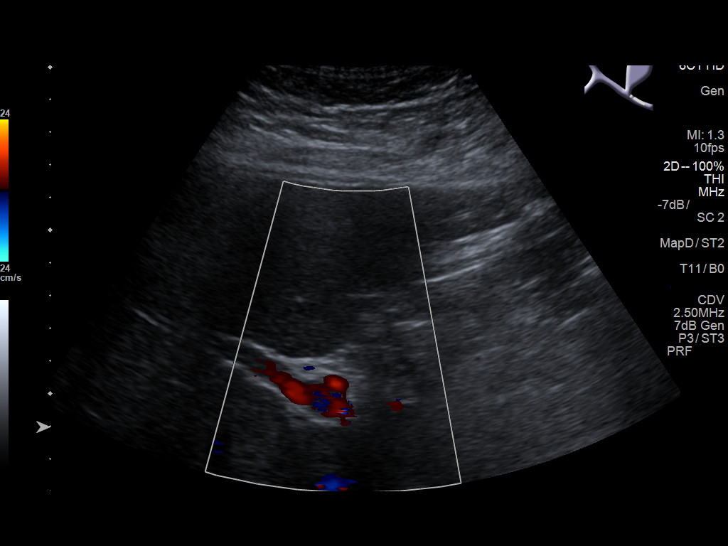
[im 27/108]
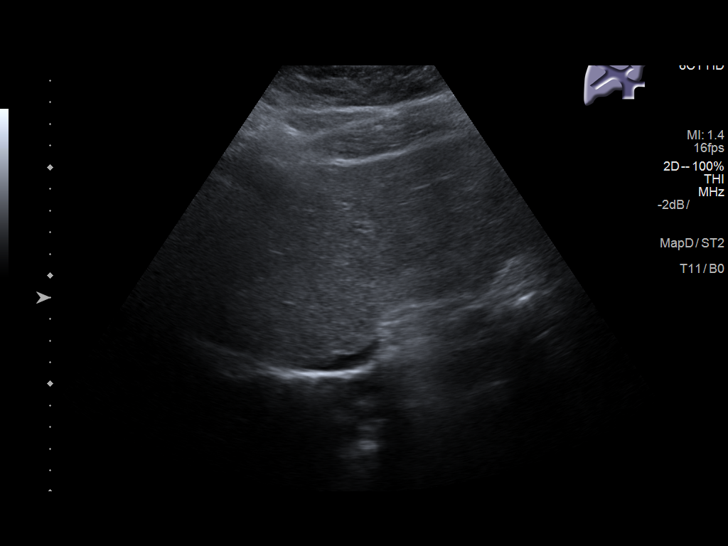
[im 36/108]
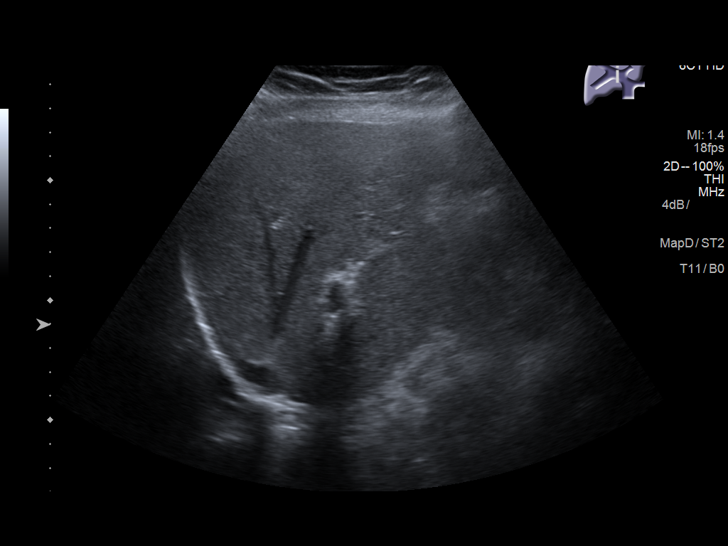
[im 41/108]
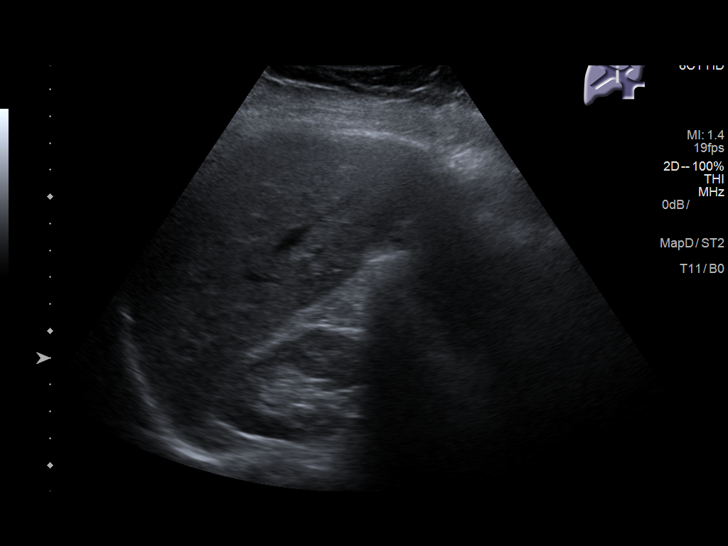
[im 50/108]
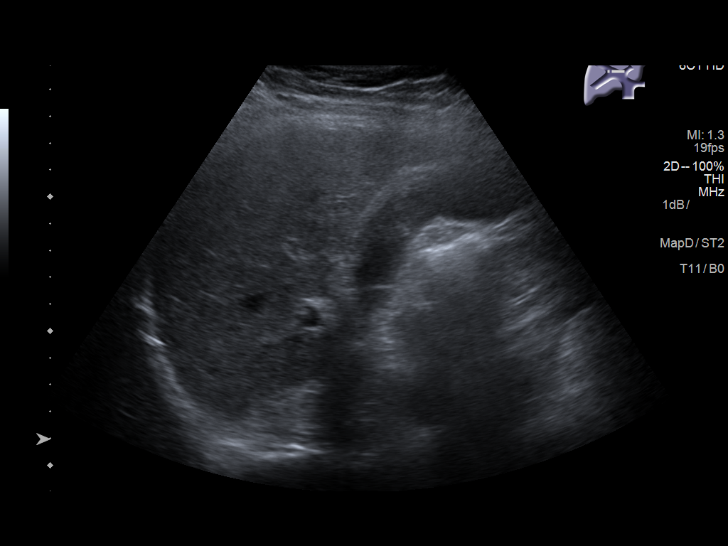
[im 58/108]
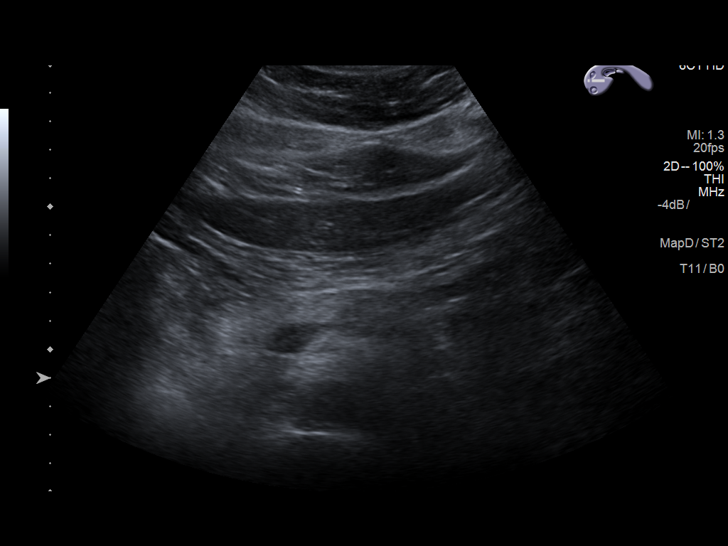
[im 67/108]
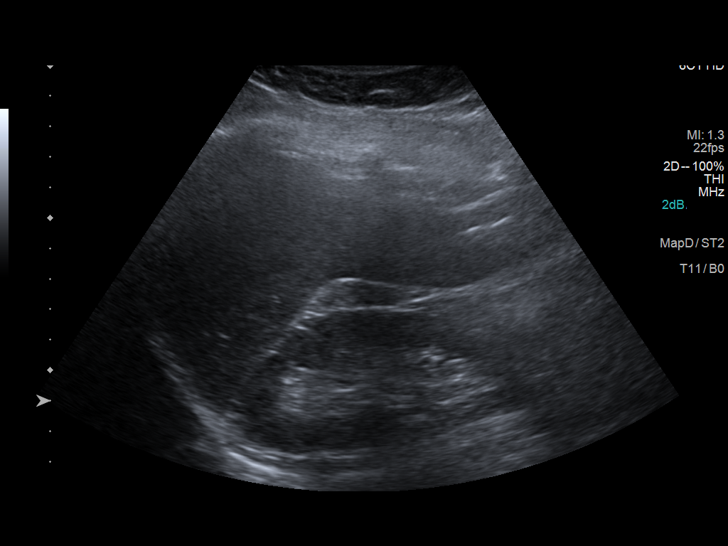
[im 72/108]
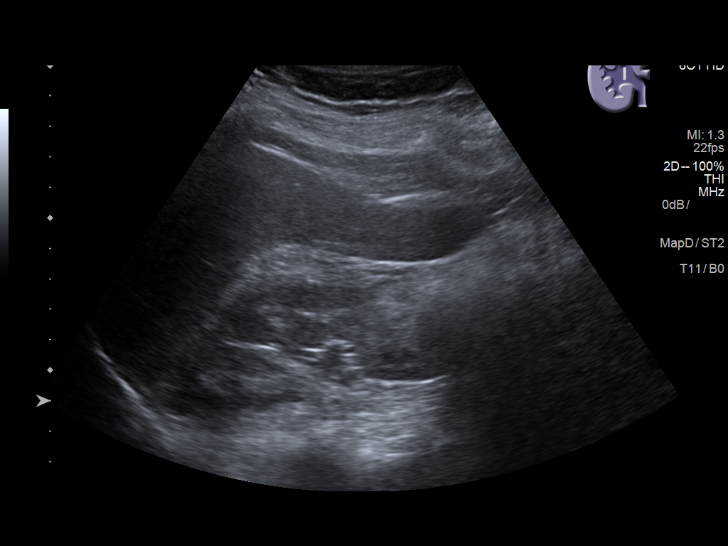
[im 81/108]
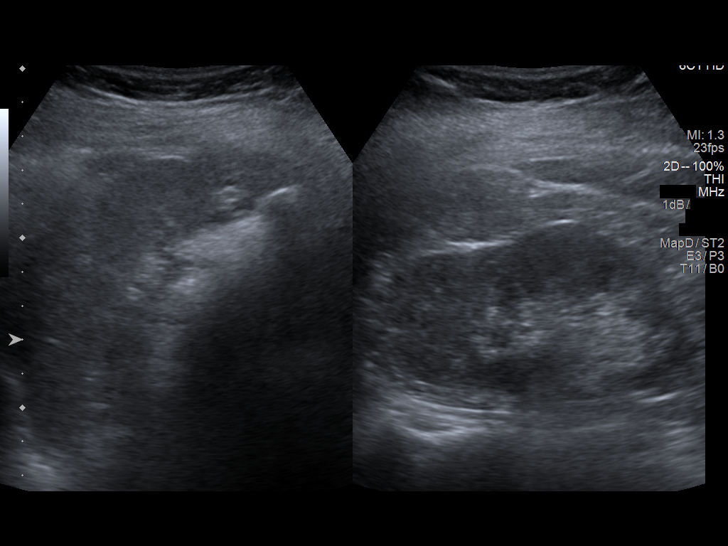
[im 90/108]
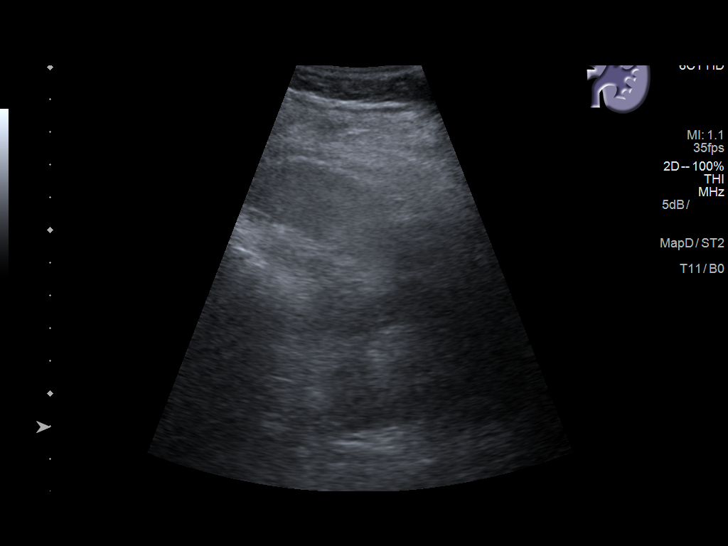
[im 99/108]
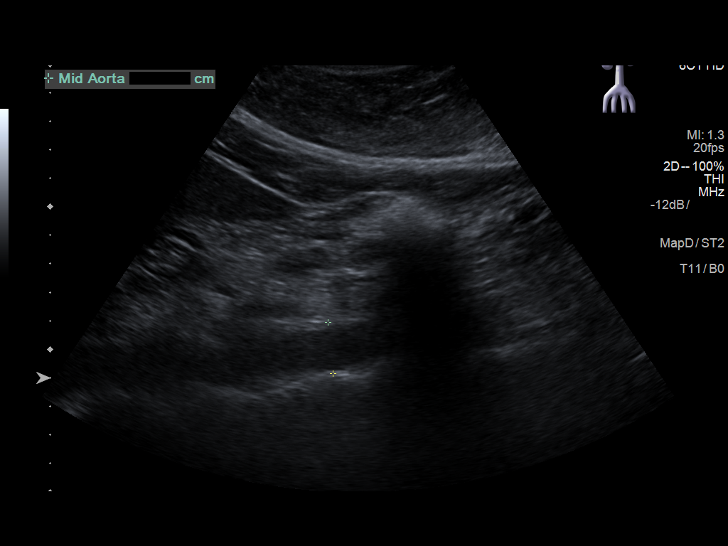
[im 108/108]
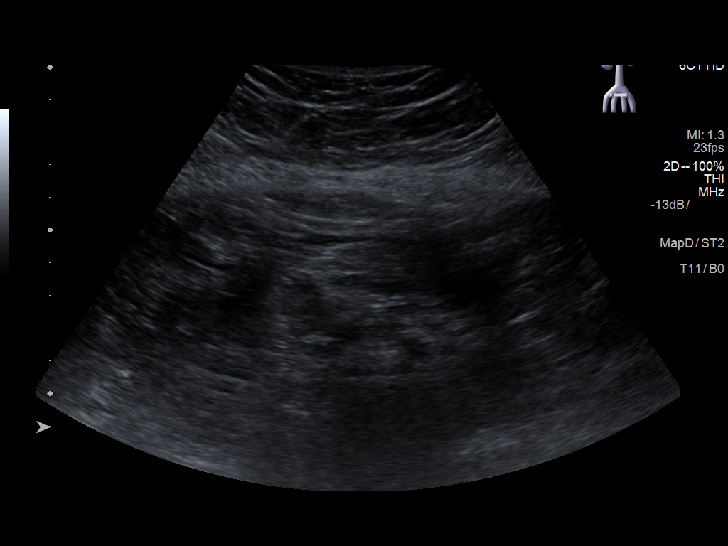

[14 of 25 positions shown; findings below may reference images not displayed]

FINDINGS: Gallbladder: No gallstones or wall thickening visualized. No
sonographic Murphy sign noted by sonographer.

Common bile duct: Diameter: 1.1 mm

Liver: No focal lesion identified. Within normal limits in
parenchymal echogenicity. Portal vein is patent on color Doppler
imaging with normal direction of blood flow towards the liver.

IVC: No abnormality visualized.

Pancreas: Visualized portion unremarkable.

Spleen: Size and appearance within normal limits.

Right Kidney: Length: 10.0 cm. Echogenicity within normal limits. No
mass or hydronephrosis visualized.

Left Kidney: Length: 10.6 cm. Echogenicity within normal limits. No
mass or hydronephrosis visualized.

Abdominal aorta: No aneurysm visualized.

Other findings: None.
IMPRESSION: Negative exam.

## 2019-05-23 ENCOUNTER — Other Ambulatory Visit: Payer: Self-pay

## 2019-05-23 ENCOUNTER — Ambulatory Visit: Payer: Managed Care, Other (non HMO) | Admitting: Nurse Practitioner

## 2019-05-23 VITALS — BP 135/85 | HR 75 | Temp 97.0°F | Resp 18 | Ht 68.0 in | Wt 187.0 lb

## 2019-05-23 DIAGNOSIS — M25532 Pain in left wrist: Secondary | ICD-10-CM

## 2019-05-23 DIAGNOSIS — M67432 Ganglion, left wrist: Secondary | ICD-10-CM | POA: Diagnosis not present

## 2019-05-23 NOTE — Progress Notes (Signed)
Gina Hester is a 44 y.o. female employed by the [employer]. She presents to the [facility] today with c/o a knot on her (L) wrist. She first noted the area 4 days ago and it seems to be getting worse. She rates her pain as 2/10 at rest but increases with range of motion. She describes the pain as pressure and aching at rest and shooting with range of motion. The employee has taken nothing for pain.   PMH: Diverticulitis   Allergies: Peanuts, Cefazolin, Percocet  Medications: Piroxicam 10mg , Topiramate 15mg .   BP 135/85 (BP Location: Right Arm, Patient Position: Sitting, Cuff Size: Normal)   Pulse 75   Temp (!) 97 F (36.1 C) (Temporal)   Resp 18   Ht 5\' 8"  (1.727 m)   Wt 187 lb (84.8 kg)   SpO2 98%   BMI 28.43 kg/m   Physical Exam  Constitutional: She appears well-developed and well-nourished. No distress.  HENT:  Head: Normocephalic.  Musculoskeletal:     Left wrist: Tenderness (with range of motion) present. No deformity or lacerations.     Comments: There is a BB size raised area to the dorsum of the left wrist. Full ROM but c/o pain with flexion of the wrist. Radial pulse 2+, adequate circulation, good touch sensation. Denies numbness of tingling.   Skin: Skin is warm and dry.  Psychiatric: She has a normal mood and affect.   Assessment/Plan: Pain to the left wrist, ganglion cyst of left wrist.  Wrist splint applied to left wrist. Advil for pain and inflammation.  F/u with ortho if symptoms persist or worsen.  Employee agrees with plan.

## 2019-05-23 NOTE — Patient Instructions (Signed)
Ganglion Cyst  A ganglion cyst is a non-cancerous, fluid-filled lump that occurs near a joint or tendon. The cyst grows out of a joint or the lining of a tendon. Ganglion cysts most often develop in the hand or wrist, but they can also develop in the shoulder, elbow, hip, knee, ankle, or foot. Ganglion cysts are ball-shaped or egg-shaped. Their size can range from the size of a pea to larger than a grape. Increased activity may cause the cyst to get bigger because more fluid starts to build up. What are the causes? The exact cause of this condition is not known, but it may be related to: Inflammation or irritation around the joint. An injury. Repetitive movements or overuse. Arthritis. What increases the risk? You are more likely to develop this condition if: You are a woman. You are 43-18 years old. What are the signs or symptoms? The main symptom of this condition is a lump. It most often appears on the hand or wrist. In many cases, there are no other symptoms, but a cyst can sometimes cause: Tingling. Pain. Numbness. Muscle weakness. Weak grip. Less range of motion in a joint. How is this diagnosed? Ganglion cysts are usually diagnosed based on a physical exam. Your health care provider will feel the lump and may shine a light next to it. If it is a ganglion cyst, the light will likely shine through it. Your health care provider may order an X-ray, ultrasound, or MRI to rule out other conditions. How is this treated? Ganglion cysts often go away on their own without treatment. If you have pain or other symptoms, treatment may be needed. Treatment is also needed if the ganglion cyst limits your movement or if it gets infected. Treatment may include: Wearing a brace or splint on your wrist or finger. Taking anti-inflammatory medicine. Having fluid drained from the lump with a needle (aspiration). Getting a steroid injected into the joint. Having surgery to remove the ganglion  cyst. Placing a pad on your shoe or wearing shoes that will not rub against the cyst if it is on your foot. Follow these instructions at home: Do not press on the ganglion cyst, poke it with a needle, or hit it. Take over-the-counter and prescription medicines only as told by your health care provider. If you have a brace or splint: Wear it as told by your health care provider. Remove it as told by your health care provider. Ask if you need to remove it when you take a shower or a bath. Watch your ganglion cyst for any changes. Keep all follow-up visits as told by your health care provider. This is important. Contact a health care provider if: Your ganglion cyst becomes larger or more painful. You have pus coming from the lump. You have weakness or numbness in the affected area. You have a fever or chills. Get help right away if: You have a fever and have any of these in the cyst area: Increased redness. Red streaks. Swelling. Summary A ganglion cyst is a non-cancerous, fluid-filled lump that occurs near a joint or tendon. Ganglion cysts most often develop in the hand or wrist, but they can also develop in the shoulder, elbow, hip, knee, ankle, or foot. Ganglion cysts often go away on their own without treatment. This information is not intended to replace advice given to you by your health care provider. Make sure you discuss any questions you have with your health care provider. Document Revised: 02/20/2017 Document Reviewed: 11/07/2016 Elsevier  Patient Education  [employer].  Wrist Pain, Adult There are many things that can cause wrist pain. Some common causes include: An injury to the wrist area. Overuse of the joint. A condition that causes too much pressure to be put on a nerve in the wrist (carpal tunnel syndrome). Wear and tear of the joints that happens as a person gets older (osteoarthritis). Other types of arthritis. Sometimes, the cause of wrist pain is not  known. Often, the pain goes away when you follow your doctor's instructions for helping pain at home, such as resting or icing your wrist. If your wrist pain does not go away, it is important to tell your doctor. Follow these instructions at home: Rest the wrist area for 48 hours or more, or as long as told by your doctor. If a splint or elastic bandage has been put on your wrist, use it as told by your doctor. Take off the splint or bandage only as told by your doctor. Loosen the splint or bandage if your fingers tingle, lose feeling (get numb), or turn cold or blue. If directed, apply ice to the injured area: If you have a removable splint or elastic bandage, remove it as told by your doctor. Put ice in a plastic bag. Place a towel between your skin and the bag or between your splint or bandage and the bag. Leave the ice on for 20 minutes, 2-3 times a day.  Keep your arm raised (elevated) above the level of your heart while you are sitting or lying down. Take over-the-counter and prescription medicines only as told by your doctor. Keep all follow-up visits as told by your doctor. This is important. Contact a doctor if: You have a sudden sharp pain in the wrist, hand, or arm that is different or new. The swelling or bruising on your wrist or hand gets worse. Your skin becomes red, gets a rash, or has open sores. Your pain does not get better or it gets worse. Get help right away if: You lose feeling in your fingers or hand. Your fingers turn white, very red, or cold and blue. You cannot move your fingers. You have a fever or chills. This information is not intended to replace advice given to you by your health care provider. Make sure you discuss any questions you have with your health care provider. Document Revised: 02/20/2017 Document Reviewed: 09/27/2015 Elsevier Patient Education  2020 Else[employer] Inc.  Wrist Splint, Adult A wrist splint holds your wrist in a position so it does not  move. A splint supports your wrist like a cast, but it is not stiff like a cast (is flexible). You can take it off or make it looser. You may need a wrist splint if you hurt your wrist or have swelling in your wrist. A splint can: Support your wrist. Protect your wrist when it is hurt (injured). Help you not hurt your wrist again. Make your wrist stay still and not move. Lessen pain. Help your wrist heal. It is important to wear your splint as told by your doctor. This helps you make sure that your wrist heals the right way. What are the risks? If you wear your splint too tight or you have a lot of swelling, blood may not be able to go to your wrist or hand. If this happens, you can get a condition called compartment syndrome. It can be dangerous and cause damage that lasts. Symptoms are: Pain in your wrist that gets worse.  Tingling. Having no feeling in your wrist or hand. This is called numbness. Changes in skin color. The skin may look very light (pale) or kind of blue. Cold fingers. Other risks of wearing a splint may be: A stiff wrist. A weak wrist. Skin irritation that can cause: Itching. Rash. Sores. Infection. How to use your wrist splint Your wrist splint should be tight enough to support your wrist. It should not block blood from going to your hand or wrist. Your doctor will tell you how to wear your wrist splint and how long to wear it. Splint wear Wear the splint as told by your doctor. Only take it off as told by your doctor. Loosen the splint if your fingers tingle, get numb, or turn cold and blue. Keep the splint clean. If the splint is not waterproof: Do not let it get wet. Cover it with a watertight covering when you take a bath or a shower Do not stick anything inside the splint to scratch your skin. Doing that increases your risk of infection. Check the skin under the splint every time you take it off. Check for any redness or blisters. Tell your doctor about any  skin problems. Managing pain, stiffness, and swelling  If directed, put ice on the injured area. If you a have a splint that can be taken off, take it off as told by your doctor. Put ice in a plastic bag. Place a towel between your skin and the bag. Leave the ice on for 20 minutes, 2-3 times a day. Move your fingers often to avoid stiffness and to lessen swelling. Raise (elevate) the injured area above the level of your heart while you are sitting or lying down. Activity Return to your normal activities as told by your doctor. Ask your doctor what activities are safe for you. Do exercises as told by your doctor. Ask your doctor when it is safe to drive with a splint on your wrist. General instructions Do not use the injured limb to support (bear) your body weight until your doctor says that you can. Do not put pressure on any part of the splint until it is fully hardened. This may take many hours. Do not use any products that have nicotine or tobacco in them, such as cigarettes and e-cigarettes. If you need help quitting, ask your doctor. Take over-the-counter and prescription medicines only as told by your doctor. Keep all follow-up visits as told by your doctor. This is important. Get help if: You have wrist pain or swelling that does not go away. The skin around or under your splint gets red, itchy, or moist. You have chills or fever. Your splint feels too tight or too loose. Your splint breaks. Get help right away if: You have pain that gets worse. You have tingling and numbness. You have changes in skin color, including paleness or a bluish color. Your fingers are cold. Summary A wrist splint is a flexible device that supports your wrist and keeps your wrist from moving. It is important to wear your splint as told by your doctor. This helps to make sure that your wrist heals correctly. Icing, moving your fingers, and raising your wrist above the level of your heart will help you  manage pain, stiffness, and swelling. Your wrist splint should be tight enough to support your wrist. It should not block your blood supply. Get help right away if your fingers tingle, get numb, or turn cold and blue. Loosen the splint right away.  This information is not intended to replace advice given to you by your health care provider. Make sure you discuss any questions you have with your health care provider. Document Revised: 06/28/2018 Document Reviewed: 05/28/2016 Elsevier Patient Education  2020 [employer].

## 2019-10-12 ENCOUNTER — Encounter: Payer: Self-pay | Admitting: Nurse Practitioner

## 2019-10-12 ENCOUNTER — Ambulatory Visit: Payer: Managed Care, Other (non HMO) | Admitting: Nurse Practitioner

## 2019-10-12 ENCOUNTER — Other Ambulatory Visit: Payer: Self-pay

## 2019-10-12 VITALS — BP 112/76 | HR 72 | Temp 98.0°F | Resp 16 | Ht 68.0 in | Wt 189.0 lb

## 2019-10-12 DIAGNOSIS — Z008 Encounter for other general examination: Secondary | ICD-10-CM

## 2019-10-12 NOTE — Progress Notes (Signed)
Subjective:     Patient ID: Gina Hester, female   DOB: 06/22/1975, 44 y.o.   MRN: [id number]  HPI Gina Hester is a 44 y.o. female who presents to the [facility] for her annual biometric screening exam. 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 She has been at [employer] for 16 years. She enjoys her job and does a lot of walking. She denies any problems today other than her chronic back problems.   PMH: Diverticulitis, Back injury Surg: Abdominal hysterectomy, Sacroiliac joint fusion (left) s/p injury at work.  SH: denies use of tobacco or drugs, occasional ETOH use  Meds: Topamax, Feldene Allergies:Peanuts, Cefazolin (Percocet causes nausea).  Diet/Exercise: Does a lot of walking at work but not regular exercise routine, due to work eats a lot of fast food.   Review of Systems  Musculoskeletal: Positive for back pain (chronic).  All other systems reviewed and are negative.      Objective: BP 112/76 (BP Location: Right Arm, Patient Position: Sitting, Cuff Size: Normal)   Pulse 72   Temp 98 F (36.7 C) (Temporal)   Resp 16   Ht 5\' 8"  (1.727 m)   Wt 189 lb (85.7 kg)   SpO2 97%   BMI 28.74 kg/m     Physical Exam Vitals and nursing note reviewed.  Constitutional:      General: She is not in acute distress.    Appearance: Normal appearance.  HENT:     Head: Normocephalic.     Jaw: No trismus.     Right Ear: Tympanic membrane and ear canal normal.     Left Ear: Tympanic membrane and ear canal normal.     Nose: Nose normal.     Mouth/Throat:     Mouth: Mucous membranes are moist.  Eyes:     General: Lids are normal.     Extraocular Movements: Extraocular movements intact.     Conjunctiva/sclera: Conjunctivae normal.  Neck:     Thyroid: No thyroid mass.     Vascular: No carotid bruit.  Cardiovascular:     Rate and Rhythm: Normal rate and regular rhythm.  Pulmonary:     Effort: Pulmonary effort is normal.     Breath sounds: Normal breath  sounds.  Abdominal:     Palpations: Abdomen is soft.     Tenderness: There is no abdominal tenderness. There is no right CVA tenderness or left CVA tenderness.  Musculoskeletal:        General: Normal range of motion.     Cervical back: Normal range of motion. No tenderness. No muscular tenderness.  Skin:    General: Skin is warm and dry.  Neurological:     General: No focal deficit present.     Mental Status: She is alert.     Cranial Nerves: No facial asymmetry.     Motor: No weakness.     Coordination: Romberg sign negative.     Deep Tendon Reflexes:     Reflex Scores:      Bicep reflexes are 2+ on the right side and 2+ on the left side.      Brachioradialis reflexes are 2+ on the right side and 2+ on the left side.      Patellar reflexes are 2+ on the right side and 2+ on the left side.    Comments: Grips are equal, radial pulses 2+. Ambulatory with steady gait, stands on one foot without difficulty.   Psychiatric:  Mood and Affect: Mood normal.        Behavior: Behavior normal.        Assessment:    44 y.o. female here for annual biometric screening exam. Limited physical exam without acute findings. Hx chronic back pain s/p injury.     Plan:    Discussed with the employee regular diet and exercise plan. Employee given opportunity to ask questions. All questions answered and employee voices understanding. D/c instructions given verbally and in writing. Employee is to return in one year or sooner for problems.

## 2019-10-12 NOTE — Patient Instructions (Signed)
Health Maintenance, Female Adopting a healthy lifestyle and getting preventive care are important in promoting health and wellness. Ask your health care provider about:  The right schedule for you to have regular tests and exams.  Things you can do on your own to prevent diseases and keep yourself healthy. What should I know about diet, weight, and exercise? Eat a healthy diet   Eat a diet that includes plenty of vegetables, fruits, low-fat dairy products, and lean protein.  Do not eat a lot of foods that are high in solid fats, added sugars, or sodium. Maintain a healthy weight Body mass index (BMI) is used to identify weight problems. It estimates body fat based on height and weight. Your health care provider can help determine your BMI and help you achieve or maintain a healthy weight. Get regular exercise Get regular exercise. This is one of the most important things you can do for your health. Most adults should:  Exercise for at least 150 minutes each week. The exercise should increase your heart rate and make you sweat (moderate-intensity exercise).  Do strengthening exercises at least twice a week. This is in addition to the moderate-intensity exercise.  Spend less time sitting. Even light physical activity can be beneficial. Watch cholesterol and blood lipids Have your blood tested for lipids and cholesterol at 43 years of age, then have this test every 5 years. Have your cholesterol levels checked more often if:  Your lipid or cholesterol levels are high.  You are older than 43 years of age.  You are at high risk for heart disease. What should I know about cancer screening? Depending on your health history and family history, you may need to have cancer screening at various ages. This may include screening for:  Breast cancer.  Cervical cancer.  Colorectal cancer.  Skin cancer.  Lung cancer. What should I know about heart disease, diabetes, and high blood  pressure? Blood pressure and heart disease  High blood pressure causes heart disease and increases the risk of stroke. This is more likely to develop in people who have high blood pressure readings, are of African descent, or are overweight.  Have your blood pressure checked: ? Every 3-5 years if you are 18-39 years of age. ? Every year if you are 40 years old or older. Diabetes Have regular diabetes screenings. This checks your fasting blood sugar level. Have the screening done:  Once every three years after age 40 if you are at a normal weight and have a low risk for diabetes.  More often and at a younger age if you are overweight or have a high risk for diabetes. What should I know about preventing infection? Hepatitis B If you have a higher risk for hepatitis B, you should be screened for this virus. Talk with your health care provider to find out if you are at risk for hepatitis B infection. Hepatitis C Testing is recommended for:  Everyone born from 1945 through 1965.  Anyone with known risk factors for hepatitis C. Sexually transmitted infections (STIs)  Get screened for STIs, including gonorrhea and chlamydia, if: ? You are sexually active and are younger than 43 years of age. ? You are older than 43 years of age and your health care provider tells you that you are at risk for this type of infection. ? Your sexual activity has changed since you were last screened, and you are at increased risk for chlamydia or gonorrhea. Ask your health care provider if   you are at risk.  Ask your health care provider about whether you are at high risk for HIV. Your health care provider may recommend a prescription medicine to help prevent HIV infection. If you choose to take medicine to prevent HIV, you should first get tested for HIV. You should then be tested every 3 months for as long as you are taking the medicine. Pregnancy  If you are about to stop having your period (premenopausal) and  you may become pregnant, seek counseling before you get pregnant.  Take 400 to 800 micrograms (mcg) of folic acid every day if you become pregnant.  Ask for birth control (contraception) if you want to prevent pregnancy. Osteoporosis and menopause Osteoporosis is a disease in which the bones lose minerals and strength with aging. This can result in bone fractures. If you are 20 years old or older, or if you are at risk for osteoporosis and fractures, ask your health care provider if you should:  Be screened for bone loss.  Take a calcium or vitamin D supplement to lower your risk of fractures.  Be given hormone replacement therapy (HRT) to treat symptoms of menopause. Follow these instructions at home: Lifestyle  Do not use any products that contain nicotine or tobacco, such as cigarettes, e-cigarettes, and chewing tobacco. If you need help quitting, ask your health care provider.  Do not use street drugs.  Do not share needles.  Ask your health care provider for help if you need support or information about quitting drugs. Alcohol use  Do not drink alcohol if: ? Your health care provider tells you not to drink. ? You are pregnant, may be pregnant, or are planning to become pregnant.  If you drink alcohol: ? Limit how much you use to 0-1 drink a day. ? Limit intake if you are breastfeeding.  Be aware of how much alcohol is in your drink. In the [place] one drink equals one 12 oz bottle of beer (355 mL), one 5 oz glass of wine (148 mL), or one 1 oz glass of hard liquor (44 mL). General instructions  Schedule regular health, dental, and eye exams.  Stay current with your vaccines.  Tell your health care provider if: ? You often feel depressed. ? You have ever been abused or do not feel safe at home. Summary  Adopting a healthy lifestyle and getting preventive care are important in promoting health and wellness.  Follow your health care provider's instructions about healthy  diet, exercising, and getting tested or screened for diseases.  Follow your health care provider's instructions on monitoring your cholesterol and blood pressure. This information is not intended to replace advice given to you by your health care provider. Make sure you discuss any questions you have with your health care provider. Document Revised: 03/03/2018 Document Reviewed: 03/03/2018 Elsevier Patient Education  2020 [employer].  Exercising to Stay Healthy To become healthy and stay healthy, it is recommended that you do moderate-intensity and vigorous-intensity exercise. You can tell that you are exercising at a moderate intensity if your heart starts beating faster and you start breathing faster but can still hold a conversation. You can tell that you are exercising at a vigorous intensity if you are breathing much harder and faster and cannot hold a conversation while exercising. Exercising regularly is important. It has many health benefits, such as:  Improving overall fitness, flexibility, and endurance.  Increasing bone density.  Helping with weight control.  Decreasing body fat.  Increasing muscle  strength.  Reducing stress and tension.  Improving overall health. How often should I exercise? Choose an activity that you enjoy, and set realistic goals. Your health care provider can help you make an activity plan that works for you. Exercise regularly as told by your health care provider. This may include:  Doing strength training two times a week, such as: ? Lifting weights. ? Using resistance bands. ? Push-ups. ? Sit-ups. ? Yoga.  Doing a certain intensity of exercise for a given amount of time. Choose from these options: ? A total of 150 minutes of moderate-intensity exercise every week. ? A total of 75 minutes of vigorous-intensity exercise every week. ? A mix of moderate-intensity and vigorous-intensity exercise every week. Children, pregnant women, people who  have not exercised regularly, people who are overweight, and older adults may need to talk with a health care provider about what activities are safe to do. If you have a medical condition, be sure to talk with your health care provider before you start a new exercise program. What are some exercise ideas? Moderate-intensity exercise ideas include:  Walking 1 mile (1.6 km) in about 15 minutes.  Biking.  Hiking.  Golfing.  Dancing.  Water aerobics. Vigorous-intensity exercise ideas include:  Walking 4.5 miles (7.2 km) or more in about 1 hour.  Jogging or running 5 miles (8 km) in about 1 hour.  Biking 10 miles (16.1 km) or more in about 1 hour.  Lap swimming.  Roller-skating or in-line skating.  Cross-country skiing.  Vigorous competitive sports, such as football, basketball, and soccer.  Jumping rope.  Aerobic dancing. What are some everyday activities that can help me to get exercise?  Yard work, such as: ? Pushing a Surveyor, mining. ? Raking and bagging leaves.  Washing your car.  Pushing a stroller.  Shoveling snow.  Gardening.  Washing windows or floors. How can I be more active in my day-to-day activities?  Use stairs instead of an elevator.  Take a walk during your lunch break.  If you drive, park your car farther away from your work or school.  If you take public transportation, get off one stop early and walk the rest of the way.  Stand up or walk around during all of your indoor phone calls.  Get up, stretch, and walk around every 30 minutes throughout the day.  Enjoy exercise with a friend. Support to continue exercising will help you keep a regular routine of activity. What guidelines can I follow while exercising?  Before you start a new exercise program, talk with your health care provider.  Do not exercise so much that you hurt yourself, feel dizzy, or get very short of breath.  Wear comfortable clothes and wear shoes with good  support.  Drink plenty of water while you exercise to prevent dehydration or heat stroke.  Work out until your breathing and your heartbeat get faster. Where to find more information  [place] Department of Health and Human Services: ThisPath.fi  Centers for Disease Control and Prevention (CDC): FootballExhibition.com.br Summary  Exercising regularly is important. It will improve your overall fitness, flexibility, and endurance.  Regular exercise also will improve your overall health. It can help you control your weight, reduce stress, and improve your bone density.  Do not exercise so much that you hurt yourself, feel dizzy, or get very short of breath.  Before you start a new exercise program, talk with your health care provider. This information is not intended to replace advice given  to you by your health care provider. Make sure you discuss any questions you have with your health care provider. Document Revised: 02/20/2017 Document Reviewed: 01/29/2017 Elsevier Patient Education  2020 [employer].

## 2019-10-13 LAB — GLUCOSE, RANDOM: Glucose: 87 mg/dL (ref 65–99)

## 2019-10-13 LAB — LIPID PANEL
Chol/HDL Ratio: 4.2 ratio (ref 0.0–4.4)
Cholesterol, Total: 195 mg/dL (ref 100–199)
HDL: 46 mg/dL (ref >39)
LDL Chol Calc (NIH): 128 mg/dL — ABNORMAL HIGH (ref 0–99)
Triglycerides: 114 mg/dL (ref 0–149)
VLDL Cholesterol Cal: 21 mg/dL (ref 5–40)

## 2020-03-30 ENCOUNTER — Telehealth: Payer: Managed Care, Other (non HMO) | Admitting: Nurse Practitioner

## 2020-03-30 DIAGNOSIS — J018 Other acute sinusitis: Secondary | ICD-10-CM | POA: Diagnosis not present

## 2020-03-30 MED ORDER — AMOXICILLIN 500 MG PO CAPS: 500.0000 mg | ORAL_CAPSULE | Freq: Three times a day (TID) | ORAL | 0 refills | Status: AC

## 2020-03-30 MED ORDER — FLUCONAZOLE 150 MG PO TABS: 150.0000 mg | ORAL_TABLET | Freq: Once | ORAL | 0 refills | Status: AC

## 2020-03-30 NOTE — Progress Notes (Signed)
Virtual Visit via Video Note  I connected with Gina Hester on 03/30/20 at  1:30 PM EST by a video enabled telemedicine application and verified that I am speaking with the correct person using two identifiers.  Location: Patient: [name] department Provider: [facility]   I discussed the limitations of evaluation and management by telemedicine and the availability of in person appointments. The patient expressed understanding and agreed to proceed.  History of Present Illness: Gina Hester is a 45 y.o. female with c/o sinus congestion, ear fullness, pressure feeling around eyes and upper teeth. She reports occasional cough but is only when she has post nasal drainage that irritates her throat. She denies fever, chills, n/v/d.  Onset of symptoms 3 or 4 days ago. Started OTC medications 3 days ago (Dayquil). She reports she has increased her fluids and tried to stay hydrated. Past Medical History:  Diagnosis Date  . Diverticulitis 2016  . Medical history non-contributory    Current Outpatient Medications on File Prior to Visit  Medication Sig Dispense Refill  . piroxicam (FELDENE) 10 MG capsule Take by mouth.    . topiramate (TOPAMAX) 15 MG capsule Take by mouth.     No current facility-administered medications on file prior to visit.   Allergies  Allergen Reactions  . Peanut Oil Anaphylaxis  . Peanut-Containing Drug Products Anaphylaxis  . Cefazolin Sodium-Dextrose     Increased HR to 140 and wheezing after administration  . Percocet [Oxycodone-Acetaminophen] Nausea And Vomiting      Observations/Objective: Patient appears in NAD, she is alert. She does have nasal congestion and watery eyes.   Assessment and Plan: 45 y.o. female with sinus congestion and feeling of ear pressure. She does not appear toxic.  Rx Amoxicillin (listed allergy to Cefazolin but patient states she can take Amoxil) Rx Diflucan due to antibiotics sometimes causing yeast  infection. Increase PO fluids, rest, vaporizer. Discussed with the patient and all questioned fully answered.   Follow Up Instructions:    I discussed the assessment and treatment plan with the patient. The patient was provided an opportunity to ask questions and all were answered. The patient agreed with the plan and demonstrated an understanding of the instructions.   The patient was advised to call back or seek an in-person evaluation if the symptoms worsen or if the condition fails to improve as anticipated. If symptoms worsen over the weekend she will go to Urgent Care or the ED for evaluation.  I provided 10 minutes of non-face-to-face time during this encounter.   Kerrie Buffalo, NP

## 2020-03-30 NOTE — Patient Instructions (Signed)

## 2020-06-14 ENCOUNTER — Encounter: Payer: Self-pay | Admitting: Physician Assistant

## 2020-06-14 ENCOUNTER — Ambulatory Visit: Payer: Managed Care, Other (non HMO) | Admitting: Physician Assistant

## 2020-06-14 ENCOUNTER — Other Ambulatory Visit: Payer: Self-pay

## 2020-06-14 VITALS — BP 118/80 | HR 72 | Temp 99.0°F | Resp 16 | Ht 67.0 in | Wt 195.0 lb

## 2020-06-14 DIAGNOSIS — R0981 Nasal congestion: Secondary | ICD-10-CM

## 2020-06-14 DIAGNOSIS — J302 Other seasonal allergic rhinitis: Secondary | ICD-10-CM

## 2020-06-14 DIAGNOSIS — R059 Cough, unspecified: Secondary | ICD-10-CM | POA: Diagnosis not present

## 2020-06-14 NOTE — Progress Notes (Signed)
Subjective:    Patient ID: Gina Hester, female    DOB: June 11, 1975, 45 y.o.   MRN: [id number]  HPI 45 yo F from [facility] department with [employer] Presented for visit coming from work Wearing a fabric mask  4 days hx of URI - cough, sometimes productive,  yellow mucous Non-smoker Nasal discharge clear to yellow  Has not checked temp Mild malaise , non-specific Using Mucinex and acetominophen   Aware of seasonal allergies  Has Zyrtec and Fluticasone at home but has not been using "Always" gets "sinus infections" at this time of year Had atb for same in early January 2022  No percussive pain No tooth pain No fever Has post nasal drip - worse in the afternoon and marked on reclining for sleep Eyes clear but can be itchy  Ears feel stuffy on and off- no popping or squeaking  Denies sore throat, was scratchy early on  Had her CV Vac-19  1 &2  last year ,,, but not booster.. Husband has had NO vaccines He caught CV during winter - mild case- but Gina Hester also became ill- tested positive.  Family picture complicated by His brother involved in MVA late last year. Spent last 9 months in hospital  with resection right hip and right leg amputation ( ?) considered "not possible" to fit with prosthesis- at least at this point. Still requiring significant care.Transferred to local rehab and Gina Hester's husband understandably wants to visit with him there - couple  conflict because neither of the men has had vaccine (according to their understanding) and she is fearful of potential exposure to brother in law by her unvaccinated  husband who is out and about.... and refuses to get vaccine to this point  Review of Systems  As noted above Had amoxicillin 500mg  TID as recently a 03/30/20  Reports hx of Gina Hester rod x3 placement - feels responsible for he weight gain .   Does not have exercise program Used to be a member of the [employer]"     Objective:   Physical Exam Vitals and  nursing note reviewed.  Constitutional:      Appearance: Normal appearance.     Comments: BMI 30.5 Alert and interactive Well dressed in uniform, packing  Weight was her estimate, at her request 5'7"      195      Will remove equipment belt in future  Percussion across sinuses negative for discomfort  HENT:     Head: Normocephalic and atraumatic.     Right Ear: Tympanic membrane, ear canal and external ear normal.     Left Ear: Tympanic membrane, ear canal and external ear normal.     Ears:     Comments: Small amount cerumen deep Right Non-obstructive   Warm wash only -no Qtips   Left canal clean and clear    Nose: Congestion and rhinorrhea present.     Comments: Clear discharge  No tooth or jaw pain    Mouth/Throat:     Mouth: Mucous membranes are moist.     Pharynx: No oropharyngeal exudate or posterior oropharyngeal erythema.  Eyes:     General:        Right eye: No discharge.        Left eye: No discharge.     Extraocular Movements: Extraocular movements intact.     Conjunctiva/sclera: Conjunctivae normal.  Cardiovascular:     Rate and Rhythm: Normal rate and regular rhythm.     Pulses: Normal pulses.  Heart sounds: No murmur heard.   Pulmonary:     Effort: Pulmonary effort is normal. No respiratory distress.     Breath sounds: Normal breath sounds. No wheezing or rhonchi.     Comments:  97%     resp 16 Intermittent cough, bronchial Abdominal:     Comments: overweight  Musculoskeletal:        General: Normal range of motion.     Cervical back: Normal range of motion and neck supple. No tenderness.     Comments: Ambulatory without assistance Just came from work  Lymphadenopathy:     Cervical: No cervical adenopathy.  Skin:    General: Skin is warm and dry.     Capillary Refill: Capillary refill takes less than 2 seconds.  Neurological:     General: No focal deficit present.     Mental Status: She is alert.  Psychiatric:        Mood and Affect: Mood  normal.       Assessment & Plan:  Seasonal allergies Congestion Cough  Add back ceterizine and use 1 BID  X 2-3 days then decrease to 1 QD Fluticasone 1 STEN BID x 2-3 days them reduce to daily if adequate Taught correct technique ( check the  expirations on her home supplies)  Use products already on hand-- reviewed Refill with generic comparable prn Robitussin DM or CF  As directed - full generic fine as well Fluids 64 oz day - water not soda Cool mist vaporizer in bedroom  Go get CV tested today for new baseline- virus masquerades easily Results to be reported Wear masks N or KN 95 in public and especially in tight spaces  Check with Rehab facility re: brothers status for CV vaccine Visitation guidelines Defer when symptomatic  Allergies and Viral scenarios reviewed Discussed possible symptoms of exacerbation.. Plan RTC on Monday after weekend - or call report with improvement Plan CV booster when current symptoms resolve Questions fielded , recommendations reviewed RTC as above  Encourage healthy food choices and portion control Walking program 30 min per day slight increase resp and HR

## 2022-01-01 ENCOUNTER — Other Ambulatory Visit: Payer: Self-pay | Admitting: Internal Medicine

## 2022-01-01 DIAGNOSIS — Z1231 Encounter for screening mammogram for malignant neoplasm of breast: Secondary | ICD-10-CM

## 2022-01-30 ENCOUNTER — Ambulatory Visit
Admission: RE | Admit: 2022-01-30 | Discharge: 2022-01-30 | Disposition: A | Discharge status: Home / Self Care | Payer: Managed Care, Other (non HMO) | Source: Ambulatory Visit | Attending: Internal Medicine | Admitting: Internal Medicine

## 2022-01-30 DIAGNOSIS — Z1231 Encounter for screening mammogram for malignant neoplasm of breast: Secondary | ICD-10-CM | POA: Insufficient documentation

## 2022-05-15 ENCOUNTER — Encounter: Payer: Self-pay | Admitting: *Deleted

## 2022-05-16 ENCOUNTER — Ambulatory Visit: Payer: Managed Care, Other (non HMO) | Admitting: Anesthesiology

## 2022-05-16 ENCOUNTER — Ambulatory Visit
Admission: RE | Admit: 2022-05-16 | Discharge: 2022-05-16 | Disposition: A | Discharge status: Home / Self Care | Payer: Managed Care, Other (non HMO) | Attending: Gastroenterology | Admitting: Gastroenterology

## 2022-05-16 ENCOUNTER — Encounter
Admission: RE | Disposition: A | Discharge status: Home / Self Care | Payer: Self-pay | Source: Home / Self Care | Attending: Gastroenterology

## 2022-05-16 DIAGNOSIS — K635 Polyp of colon: Secondary | ICD-10-CM | POA: Diagnosis not present

## 2022-05-16 DIAGNOSIS — Z1211 Encounter for screening for malignant neoplasm of colon: Secondary | ICD-10-CM | POA: Diagnosis present

## 2022-05-16 DIAGNOSIS — K640 First degree hemorrhoids: Secondary | ICD-10-CM | POA: Diagnosis not present

## 2022-05-16 DIAGNOSIS — K5730 Diverticulosis of large intestine without perforation or abscess without bleeding: Secondary | ICD-10-CM | POA: Insufficient documentation

## 2022-05-16 DIAGNOSIS — Z83719 Family history of colon polyps, unspecified: Secondary | ICD-10-CM | POA: Diagnosis not present

## 2022-05-16 HISTORY — PX: COLONOSCOPY WITH PROPOFOL: SHX5780

## 2022-05-16 SURGERY — COLONOSCOPY WITH PROPOFOL
Anesthesia: General

## 2022-05-16 MED ORDER — MIDAZOLAM HCL 2 MG/2ML IJ SOLN
INTRAMUSCULAR | Status: AC
  Filled 2022-05-16: qty 2

## 2022-05-16 MED ORDER — MIDAZOLAM HCL 2 MG/2ML IJ SOLN
INTRAMUSCULAR | Status: DC | PRN
  Administered 2022-05-16: 2 mg via INTRAVENOUS

## 2022-05-16 MED ORDER — PROPOFOL 500 MG/50ML IV EMUL
INTRAVENOUS | Status: DC | PRN
  Administered 2022-05-16: 165 ug/kg/min via INTRAVENOUS

## 2022-05-16 MED ORDER — PROPOFOL 10 MG/ML IV BOLUS
INTRAVENOUS | Status: DC | PRN
  Administered 2022-05-16: 70 mg via INTRAVENOUS

## 2022-05-16 MED ORDER — LIDOCAINE HCL (CARDIAC) PF 100 MG/5ML IV SOSY
PREFILLED_SYRINGE | INTRAVENOUS | Status: DC | PRN
  Administered 2022-05-16: 100 mg via INTRAVENOUS

## 2022-05-16 MED ORDER — SODIUM CHLORIDE 0.9 % IV SOLN: INTRAVENOUS | Status: DC

## 2022-05-16 MED ORDER — DEXMEDETOMIDINE HCL IN NACL 200 MCG/50ML IV SOLN
INTRAVENOUS | Status: DC | PRN
  Administered 2022-05-16: 12 ug via INTRAVENOUS

## 2022-05-16 NOTE — Anesthesia Preprocedure Evaluation (Addendum)
Anesthesia Evaluation  Patient identified by MRN, date of birth, ID band Patient awake    Reviewed: Allergy & Precautions, NPO status , Patient's Chart, lab work & pertinent test results  Airway Mallampati: III  TM Distance: >3 FB Neck ROM: full    Dental  (+) Chipped   Pulmonary neg pulmonary ROS   Pulmonary exam normal        Cardiovascular negative cardio ROS Normal cardiovascular exam     Neuro/Psych negative neurological ROS  negative psych ROS   GI/Hepatic Neg liver ROS,GERD  Controlled and Medicated,,Mild GERD, controlled at present   Endo/Other  negative endocrine ROS    Renal/GU negative Renal ROS  negative genitourinary   Musculoskeletal   Abdominal   Peds  Hematology negative hematology ROS (+)   Anesthesia Other Findings Past Medical History: 2016: Diverticulitis No date: Medical history non-contributory  Past Surgical History: No date: ABDOMINAL HYSTERECTOMY 09/29/2012: SACROILIAC JOINT FUSION; Left     Comment:  Procedure: SACROILIAC JOINT FUSION- left;  Surgeon: Sinclair Ship, MD;  Location: [place];  Service:               Orthopedics;  Laterality: Left;  Left sided sacroiliac               joint fusion  BMI    Body Mass Index: 30.56 kg/m      Reproductive/Obstetrics negative OB ROS                             Anesthesia Physical Anesthesia Plan  ASA: 2  Anesthesia Plan: General   Post-op Pain Management:    Induction: Intravenous  PONV Risk Score and Plan: Propofol infusion and TIVA  Airway Management Planned: Natural Airway and Nasal Cannula  Additional Equipment:   Intra-op Plan:   Post-operative Plan:   Informed Consent: I have reviewed the patients History and Physical, chart, labs and discussed the procedure including the risks, benefits and alternatives for the proposed anesthesia with the patient or authorized  representative who has indicated his/her understanding and acceptance.     Dental Advisory Given  Plan Discussed with: Anesthesiologist, CRNA and Surgeon  Anesthesia Plan Comments: (Patient consented for risks of anesthesia including but not limited to:  - adverse reactions to medications - risk of airway placement if required - damage to eyes, teeth, lips or other oral mucosa - nerve damage due to positioning  - sore throat or hoarseness - Damage to heart, brain, nerves, lungs, other parts of body or loss of life  Patient voiced understanding.)       Anesthesia Quick Evaluation

## 2022-05-16 NOTE — Anesthesia Postprocedure Evaluation (Signed)
Anesthesia Post Note  Patient: Gina Hester  Procedure(s) Performed: COLONOSCOPY WITH PROPOFOL  Patient location during evaluation: Endoscopy Anesthesia Type: General Level of consciousness: awake and alert Pain management: pain level controlled Vital Signs Assessment: post-procedure vital signs reviewed and stable Respiratory status: spontaneous breathing, nonlabored ventilation and respiratory function stable Cardiovascular status: blood pressure returned to baseline and stable Postop Assessment: no apparent nausea or vomiting Anesthetic complications: no   No notable events documented.   Last Vitals:  Vitals:   05/16/22 0846 05/16/22 0908  BP: 92/60 116/87  Pulse: 76   Resp: 19   Temp: (!) 36.2 C   SpO2: 97%     Last Pain:  Vitals:   05/16/22 0908  TempSrc:   PainSc: 0-No pain                 Alphonsus Sias

## 2022-05-16 NOTE — Transfer of Care (Signed)
Immediate Anesthesia Transfer of Care Note  Patient: Gina Hester  Procedure(s) Performed: COLONOSCOPY WITH PROPOFOL  Patient Location: Endoscopy Unit  Anesthesia Type:General  Level of Consciousness: drowsy and patient cooperative  Airway & Oxygen Therapy: Patient Spontanous Breathing and Patient connected to face mask oxygen  Post-op Assessment: Report given to RN and Post -op Vital signs reviewed and stable  Post vital signs: Reviewed and stable  Last Vitals:  Vitals Value Taken Time  BP 92/60 05/16/22 0847  Temp 36.2 C 05/16/22 0846  Pulse 80 05/16/22 0850  Resp 16 05/16/22 0850  SpO2 95 % 05/16/22 0850  Vitals shown include unvalidated device data.  Last Pain:  Vitals:   05/16/22 0846  TempSrc: Temporal  PainSc: Asleep         Complications: No notable events documented.

## 2022-05-16 NOTE — Op Note (Signed)
[facility] Patient Name: Gina Hester Procedure Date: 05/16/2022 8:21 AM MRN: [id number] Account #: [id number] Date of Birth: Dec 29, 1975 Admit Type: Outpatient Age: 47 Room: [facility] ENDO ROOM 3 Gender: Female Note Status: Finalized Instrument Name: Park Meo [id number] Procedure:             Colonoscopy Indications:           Colon cancer screening in patient at increased risk:                         Family history of 1st-degree relative with colon polyps Providers:             Andrey Farmer MD, MD Referring MD:          Andrey Farmer MD, MD (Referring MD) Medicines:             Monitored Anesthesia Care Complications:         No immediate complications. Estimated blood loss:                         Minimal. Procedure:             Pre-Anesthesia Assessment:                        - Prior to the procedure, a History and Physical was                         performed, and patient medications and allergies were                         reviewed. The patient is competent. The risks and                         benefits of the procedure and the sedation options and                         risks were discussed with the patient. All questions                         were answered and informed consent was obtained.                         Patient identification and proposed procedure were                         verified by the physician, the nurse, the                         anesthesiologist, the anesthetist and the technician                         in the endoscopy suite. Mental Status Examination:                         alert and oriented. Airway Examination: normal                         oropharyngeal airway and neck mobility. Respiratory  Examination: clear to auscultation. CV Examination:                         normal. Prophylactic Antibiotics: The patient does not                         require prophylactic  antibiotics. Prior                         Anticoagulants: The patient has taken no anticoagulant                         or antiplatelet agents. ASA Grade Assessment: II - A                         patient with mild systemic disease. After reviewing                         the risks and benefits, the patient was deemed in                         satisfactory condition to undergo the procedure. The                         anesthesia plan was to use monitored anesthesia care                         (MAC). Immediately prior to administration of                         medications, the patient was re-assessed for adequacy                         to receive sedatives. The heart rate, respiratory                         rate, oxygen saturations, blood pressure, adequacy of                         pulmonary ventilation, and response to care were                         monitored throughout the procedure. The physical                         status of the patient was re-assessed after the                         procedure.                        After obtaining informed consent, the colonoscope was                         passed under direct vision. Throughout the procedure,                         the patient's blood pressure, pulse, and oxygen  saturations were monitored continuously. The                         Colonoscope was introduced through the anus and                         advanced to the the cecum, identified by appendiceal                         orifice and ileocecal valve. The colonoscopy was                         performed without difficulty. The patient tolerated                         the procedure well. The quality of the bowel                         preparation was excellent. The ileocecal valve,                         appendiceal orifice, and rectum were photographed. Findings:      The perianal and digital rectal examinations were normal.      A  few small-mouthed diverticula were found in the sigmoid colon.      A 1 mm polyp was found in the sigmoid colon. The polyp was hyperplastic.       The polyp was removed with a jumbo cold forceps. Resection and retrieval       were complete. Estimated blood loss was minimal.      Internal hemorrhoids were found during retroflexion. The hemorrhoids       were Grade I (internal hemorrhoids that do not prolapse).      The exam was otherwise without abnormality on direct and retroflexion       views. Impression:            - Diverticulosis in the sigmoid colon.                        - One 1 mm polyp in the sigmoid colon, removed with a                         jumbo cold forceps. Resected and retrieved.                        - Internal hemorrhoids.                        - The examination was otherwise normal on direct and                         retroflexion views. Recommendation:        - Discharge patient to home.                        - Resume previous diet.                        - Continue present medications.                        -  Await pathology results.                        - Repeat colonoscopy in 10 years for screening                         purposes.                        - Return to referring physician as previously                         scheduled. Procedure Code(s):     --- Professional ---                        [phone], Colonoscopy, flexible; with biopsy, single or                         multiple Diagnosis Code(s):     --- Professional ---                        Z83.71, Family history of colonic polyps                        K64.0, First degree hemorrhoids                        D12.5, Benign neoplasm of sigmoid colon                        K57.30, Diverticulosis of large intestine without                         perforation or abscess without bleeding CPT copyright 2022 [employer]. All rights reserved. The codes documented in this report are  preliminary and upon coder review may  be revised to meet current compliance requirements. Andrey Farmer MD, MD 05/16/2022 8:48:51 AM Number of Addenda: 0 Note Initiated On: 05/16/2022 8:21 AM Scope Withdrawal Time: 0 hours 9 minutes 45 seconds  Total Procedure Duration: 0 hours 14 minutes 23 seconds  Estimated Blood Loss:  Estimated blood loss: none. Estimated blood loss was                         minimal.      [facility]

## 2022-05-16 NOTE — Anesthesia Procedure Notes (Signed)
Procedure Name: General with mask airway Date/Time: 05/16/2022 8:40 AM  Performed by: Kelton Pillar, CRNAPre-anesthesia Checklist: Patient identified, Emergency Drugs available, Suction available and Patient being monitored Patient Re-evaluated:Patient Re-evaluated prior to induction Oxygen Delivery Method: Simple face mask Induction Type: IV induction Placement Confirmation: positive ETCO2, breath sounds checked- equal and bilateral and CO2 detector Dental Injury: Teeth and Oropharynx as per pre-operative assessment

## 2022-05-16 NOTE — H&P (Signed)
Outpatient short stay form Pre-procedure 05/16/2022  Lesly Rubenstein, MD  Primary Physician: Baxter Hire, MD  Reason for visit:  High risk screening  History of present illness:    47 y/o lady with history of obesity and family history of colon polyps in Father at unknown age here for screening colonoscopy. Last colonoscopy in 2016. No blood thinners. No significant abdominal surgeries.    Current Facility-Administered Medications:    0.9 %  sodium chloride infusion, , Intravenous, Continuous, Icelynn Onken, Hilton Cork, MD, Last Rate: 20 mL/hr at 05/16/22 0734, New Bag at 05/16/22 0734  Medications Prior to Admission  Medication Sig Dispense Refill Last Dose   piroxicam (FELDENE) 10 MG capsule Take by mouth.   Past Week   topiramate (TOPAMAX) 15 MG capsule Take by mouth.   Past Week   amoxicillin (AMOXIL) 500 MG capsule Take 1 capsule (500 mg total) by mouth 3 (three) times daily. (Patient not taking: Reported on 06/14/2020) 30 capsule 0      Allergies  Allergen Reactions   Peanut Oil Anaphylaxis   Peanut-Containing Drug Products Anaphylaxis   Cefazolin Sodium-Dextrose     Increased HR to 140 and wheezing after administration   Percocet [Oxycodone-Acetaminophen] Nausea And Vomiting     Past Medical History:  Diagnosis Date   Diverticulitis 2016   Medical history non-contributory     Review of systems:  Otherwise negative.    Physical Exam  Gen: Alert, oriented. Appears stated age.  HEENT: PERRLA. Lungs: No respiratory distress CV: RRR Abd: soft, benign, no masses Ext: No edema    Planned procedures: Proceed with colonoscopy. The patient understands the nature of the planned procedure, indications, risks, alternatives and potential complications including but not limited to bleeding, infection, perforation, damage to internal organs and possible oversedation/side effects from anesthesia. The patient agrees and gives consent to proceed.  Please refer to procedure  notes for findings, recommendations and patient disposition/instructions.     Lesly Rubenstein, MD [facility]astroenter[facility]

## 2022-05-16 NOTE — Interval H&P Note (Signed)
History and Physical Interval Note:  05/16/2022 8:24 AM  Gina Hester  has presented today for surgery, with the diagnosis of Z12.11 (ICD-10-CM) - Colon cancer screening Z83.719 (ICD-10-CM) - Family history of colonic polyps.  The various methods of treatment have been discussed with the patient and family. After consideration of risks, benefits and other options for treatment, the patient has consented to  Procedure(s): COLONOSCOPY WITH PROPOFOL (N/A) as a surgical intervention.  The patient's history has been reviewed, patient examined, no change in status, stable for surgery.  I have reviewed the patient's chart and labs.  Questions were answered to the patient's satisfaction.     Lesly Rubenstein  Ok to proceed with colonoscopy

## 2022-05-19 ENCOUNTER — Encounter: Payer: Self-pay | Admitting: Gastroenterology

## 2022-05-19 LAB — SURGICAL PATHOLOGY

## 2023-03-19 ENCOUNTER — Other Ambulatory Visit: Payer: Self-pay | Admitting: Internal Medicine

## 2023-03-19 DIAGNOSIS — Z1231 Encounter for screening mammogram for malignant neoplasm of breast: Secondary | ICD-10-CM

## 2023-04-27 ENCOUNTER — Ambulatory Visit
Admission: RE | Admit: 2023-04-27 | Discharge: 2023-04-27 | Disposition: A | Discharge status: Home / Self Care | Payer: Managed Care, Other (non HMO) | Source: Ambulatory Visit | Attending: Internal Medicine | Admitting: Internal Medicine

## 2023-04-27 DIAGNOSIS — Z1231 Encounter for screening mammogram for malignant neoplasm of breast: Secondary | ICD-10-CM | POA: Diagnosis present
